# Patient Record
Sex: Male | Born: 1975 | Race: White | Hispanic: No | Smoking: Never smoker
Health system: Southern US, Community
[De-identification: ages and names within clinical notes are randomized; demographics above are authoritative.]

## PROBLEM LIST (undated history)

## (undated) DIAGNOSIS — R109 Unspecified abdominal pain: Secondary | ICD-10-CM

## (undated) DIAGNOSIS — N1330 Unspecified hydronephrosis: Secondary | ICD-10-CM

## (undated) DIAGNOSIS — N200 Calculus of kidney: Secondary | ICD-10-CM

## (undated) DIAGNOSIS — Q6211 Congenital occlusion of ureteropelvic junction: Secondary | ICD-10-CM

## (undated) DIAGNOSIS — Q6239 Other obstructive defects of renal pelvis and ureter: Secondary | ICD-10-CM

## (undated) DIAGNOSIS — Z8719 Personal history of other diseases of the digestive system: Secondary | ICD-10-CM

---

## 2012-10-02 ENCOUNTER — Emergency Department (HOSPITAL_BASED_OUTPATIENT_CLINIC_OR_DEPARTMENT_OTHER): Payer: 59

## 2012-10-02 ENCOUNTER — Encounter (HOSPITAL_BASED_OUTPATIENT_CLINIC_OR_DEPARTMENT_OTHER): Payer: Self-pay | Admitting: *Deleted

## 2012-10-02 ENCOUNTER — Emergency Department (HOSPITAL_BASED_OUTPATIENT_CLINIC_OR_DEPARTMENT_OTHER)
Admission: EM | Admit: 2012-10-02 | Discharge: 2012-10-02 | Disposition: A | Discharge status: Home / Self Care | Payer: 59 | Attending: Emergency Medicine | Admitting: Emergency Medicine

## 2012-10-02 DIAGNOSIS — Z87448 Personal history of other diseases of urinary system: Secondary | ICD-10-CM | POA: Insufficient documentation

## 2012-10-02 DIAGNOSIS — Z87891 Personal history of nicotine dependence: Secondary | ICD-10-CM | POA: Insufficient documentation

## 2012-10-02 DIAGNOSIS — W03XXXA Other fall on same level due to collision with another person, initial encounter: Secondary | ICD-10-CM | POA: Insufficient documentation

## 2012-10-02 DIAGNOSIS — Y9363 Activity, rugby: Secondary | ICD-10-CM | POA: Insufficient documentation

## 2012-10-02 DIAGNOSIS — S20219A Contusion of unspecified front wall of thorax, initial encounter: Secondary | ICD-10-CM | POA: Insufficient documentation

## 2012-10-02 DIAGNOSIS — Y9239 Other specified sports and athletic area as the place of occurrence of the external cause: Secondary | ICD-10-CM | POA: Insufficient documentation

## 2012-10-02 NOTE — ED Provider Notes (Signed)
History     CSN: [id number]  Arrival date & time 10/02/12  1410   First MD Initiated Contact with Patient 10/02/12 1423      Chief Complaint  Patient presents with  . Rib Injury    (Consider location/radiation/quality/duration/timing/severity/associated sxs/prior treatment) HPI Comments: Patient was playing rugby yesterday and developed pain in the left ribs after being hit.  It is hard to breathe deep but feels as though he is getting enough air.  Worse with inspiration, movement, better with rest.  No other injuries.  No fever or cough.    The history is provided by the patient.    Past Medical History  Diagnosis Date  . Hydronephrosis     History reviewed. No pertinent past surgical history.  History reviewed. No pertinent family history.  History  Substance Use Topics  . Smoking status: Former Games developer  . Smokeless tobacco: Not on file  . Alcohol Use: No      Review of Systems  All other systems reviewed and are negative.    Allergies  Review of patient's allergies indicates no known allergies.  Home Medications  No current outpatient prescriptions on file.  BP 143/71  Pulse 55  Temp(Src) 97.4 F (36.3 C) (Oral)  Resp 18  Ht 5\' 7"  (1.702 m)  Wt 175 lb (79.379 kg)  BMI 27.4 kg/m2  SpO2 100%  Physical Exam  Nursing note and vitals reviewed. Constitutional: He is oriented to person, place, and time. He appears well-developed and well-nourished. No distress.  HENT:  Head: Normocephalic and atraumatic.  Mouth/Throat: Oropharynx is clear and moist.  Neck: Normal range of motion. Neck supple.  Cardiovascular: Normal rate and regular rhythm.   No murmur heard. Pulmonary/Chest: Effort normal and breath sounds normal. No respiratory distress. He has no wheezes.  There is ttp to the left chest wall just inferior to the left pectoral muscle.  Breath sounds are clear and equal bilaterally.  Abdominal: Soft. Bowel sounds are normal. He exhibits no distension.  There is no tenderness.  Musculoskeletal: Normal range of motion. He exhibits no edema.  Lymphadenopathy:    He has no cervical adenopathy.  Neurological: He is alert and oriented to person, place, and time.  Skin: Skin is warm and dry. He is not diaphoretic.    ED Course  Procedures (including critical care time)  Labs Reviewed - No data to display No results found.   No diagnosis found.    MDM  The xrays show no evidence for fracture or ptx.  Suspect chest wall contusion.  Will treat with nsaids, rest, time.  Follow up prn.        Geoffery Lyons, MD 10/02/12 [phone]

## 2012-10-02 NOTE — ED Notes (Addendum)
Pt states he was playing Rugby yest and made a tackle. Finished the game and today is having increased pain with movement and ROM. Recovering addict. Does not want pain meds. Dedra Skeens of NSAIDS.

## 2012-11-27 ENCOUNTER — Emergency Department (HOSPITAL_BASED_OUTPATIENT_CLINIC_OR_DEPARTMENT_OTHER)
Admission: EM | Admit: 2012-11-27 | Discharge: 2012-11-27 | Disposition: A | Discharge status: Home / Self Care | Payer: 59 | Attending: Emergency Medicine | Admitting: Emergency Medicine

## 2012-11-27 ENCOUNTER — Encounter (HOSPITAL_BASED_OUTPATIENT_CLINIC_OR_DEPARTMENT_OTHER): Payer: Self-pay | Admitting: *Deleted

## 2012-11-27 ENCOUNTER — Emergency Department (HOSPITAL_BASED_OUTPATIENT_CLINIC_OR_DEPARTMENT_OTHER): Payer: 59

## 2012-11-27 DIAGNOSIS — R110 Nausea: Secondary | ICD-10-CM | POA: Insufficient documentation

## 2012-11-27 DIAGNOSIS — R300 Dysuria: Secondary | ICD-10-CM | POA: Insufficient documentation

## 2012-11-27 DIAGNOSIS — N1330 Unspecified hydronephrosis: Secondary | ICD-10-CM | POA: Insufficient documentation

## 2012-11-27 DIAGNOSIS — Z8719 Personal history of other diseases of the digestive system: Secondary | ICD-10-CM | POA: Insufficient documentation

## 2012-11-27 DIAGNOSIS — Z87891 Personal history of nicotine dependence: Secondary | ICD-10-CM | POA: Insufficient documentation

## 2012-11-27 LAB — CBC WITH DIFFERENTIAL/PLATELET
HCT: 45.7 % (ref 39.0–52.0)
Hemoglobin: 16.1 g/dL (ref 13.0–17.0)
Lymphocytes Relative: 23 % (ref 12–46)
Lymphs Abs: 1.5 10*3/uL (ref 0.7–4.0)
Monocytes Absolute: 0.8 10*3/uL (ref 0.1–1.0)
Monocytes Relative: 12 % (ref 3–12)
Neutro Abs: 4.2 10*3/uL (ref 1.7–7.7)
Neutrophils Relative %: 64 % (ref 43–77)
RBC: 5.49 MIL/uL (ref 4.22–5.81)
WBC: 6.5 10*3/uL (ref 4.0–10.5)

## 2012-11-27 LAB — URINALYSIS, MICROSCOPIC ONLY
Glucose, UA: NEGATIVE mg/dL
Hgb urine dipstick: NEGATIVE
Ketones, ur: 15 mg/dL — AB
Protein, ur: NEGATIVE mg/dL
Urobilinogen, UA: 1 mg/dL (ref 0.0–1.0)

## 2012-11-27 LAB — COMPREHENSIVE METABOLIC PANEL
AST: 31 U/L (ref 0–37)
Albumin: 4.3 g/dL (ref 3.5–5.2)
Alkaline Phosphatase: 104 U/L (ref 39–117)
BUN: 20 mg/dL (ref 6–23)
CO2: 28 mEq/L (ref 19–32)
Chloride: 106 mEq/L (ref 96–112)
Creatinine, Ser: 1.5 mg/dL — ABNORMAL HIGH (ref 0.50–1.35)
GFR calc non Af Amer: 58 mL/min — ABNORMAL LOW (ref >90)
Potassium: 4.2 mEq/L (ref 3.5–5.1)
Total Bilirubin: 0.8 mg/dL (ref 0.3–1.2)

## 2012-11-27 MED ORDER — ONDANSETRON HCL 4 MG/2ML IJ SOLN: 4.0000 mg | Freq: Once | INTRAMUSCULAR | Status: AC

## 2012-11-27 MED ORDER — ONDANSETRON HCL 4 MG/2ML IJ SOLN
INTRAMUSCULAR | Status: AC
  Administered 2012-11-27: 4 mg via INTRAVENOUS
  Filled 2012-11-27: qty 2

## 2012-11-27 MED ORDER — OXYCODONE-ACETAMINOPHEN 5-325 MG PO TABS: 1.0000 | ORAL_TABLET | ORAL | Status: DC | PRN

## 2012-11-27 MED ORDER — KETOROLAC TROMETHAMINE 30 MG/ML IJ SOLN
30.0000 mg | Freq: Once | INTRAMUSCULAR | Status: AC
  Administered 2012-11-27: 30 mg via INTRAVENOUS
  Filled 2012-11-27: qty 1

## 2012-11-27 MED ORDER — HYDROMORPHONE HCL PF 2 MG/ML IJ SOLN: 2.0000 mg | Freq: Once | INTRAMUSCULAR | Status: AC

## 2012-11-27 MED ORDER — HYDROMORPHONE HCL PF 2 MG/ML IJ SOLN
INTRAMUSCULAR | Status: AC
  Administered 2012-11-27: 2 mg via INTRAVENOUS
  Filled 2012-11-27: qty 1

## 2012-11-27 NOTE — ED Notes (Signed)
Pt states he has had RUQ and Right flank pain x 12 hrs. 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

## 2012-11-27 NOTE — ED Provider Notes (Signed)
History  This chart was scribed for Jack Human, MD by Greggory Stallion, ED Scribe. This patient was seen in room MH08/MH08 and the patient's care was started at 5:06 PM.   CSN: [id number]  Arrival date & time 11/27/12  1608   First MD Initiated Contact with Patient 11/27/12 1706      Chief Complaint  Patient presents with  . Flank Pain  . Abdominal Pain     Patient is a 37 y.o. male presenting with abdominal pain. The history is provided by the patient. No language interpreter was used.  Abdominal Pain Pain location:  R flank, RUQ and epigastric Pain severity:  Moderate Onset quality:  Sudden Duration:  10 hours Timing:  Constant Progression:  Unchanged Chronicity:  New Relieved by:  Nothing Worsened by:  Nothing tried Ineffective treatments:  None tried Associated symptoms: nausea   Associated symptoms: no chest pain, no chills, no constipation, no cough, no diarrhea, no fever, no shortness of breath, no sore throat and no vomiting     HPI Comments: Jack Becker is a 37 y.o. male with h/o hydronephrosis and right ureter obstruction who presents to the Emergency Department complaining of moderate, constant, epigastric, RUQ and right flank pain that started at 5 AM this morning. There is associated nausea. Patient states that he hasn't had a urine void for several hours. Patient denies any precipitating factors for pain and states that he has had no recent changes in diet, trauma or injury to the affected areas. He states that current symptoms are similar in character to past history. He states that he does not have a local urologist, but has seen a specialist in [place] in the past. He had a renal scan/CT more than 1 year ago which showed 50% right kidney function. Patient denies vomiting, fever, neck pain, sore throat, visual disturbance, CP, cough, SOB, diarrhea, back pain, HA, weakness, numbness and rash as associated symptoms. Pt states he would like non-narcotic pain  medication. Pt denies any smoking or alcohol use.     Past Medical History  Diagnosis Date  . Hydronephrosis   . Diverticulitis     History reviewed. No pertinent past surgical history.  History reviewed. No pertinent family history.  History  Substance Use Topics  . Smoking status: Former Games developer  . Smokeless tobacco: Not on file  . Alcohol Use: No      Review of Systems  Constitutional: Negative for fever and chills.  HENT: Negative for sore throat and rhinorrhea.   Eyes: Negative for visual disturbance.  Respiratory: Negative for cough and shortness of breath.   Cardiovascular: Negative for chest pain.  Gastrointestinal: Positive for nausea and abdominal pain. Negative for vomiting, diarrhea and constipation.  Genitourinary: Positive for flank pain and difficulty urinating.  Musculoskeletal: Negative for back pain.  Skin: Negative for rash.  Neurological: Negative for dizziness and headaches.  Hematological: Does not bruise/bleed easily.  Psychiatric/Behavioral: Negative for confusion.    Allergies  Review of patient's allergies indicates no known allergies.  Home Medications  No current outpatient prescriptions on file.  Triage Vitals: BP 128/78  Pulse 58  Temp(Src) 98.3 F (36.8 C) (Oral)  Resp 18  Ht 5\' 7"  (1.702 m)  Wt 175 lb (79.379 kg)  BMI 27.4 kg/m2  SpO2 100%  Physical Exam  Constitutional: He is oriented to person, place, and time. He appears well-developed and well-nourished.  HENT:  Head: Normocephalic and atraumatic.  Mouth/Throat: Oropharynx is clear and moist.  Moist mucous membranes.  Eyes: Conjunctivae and EOM are normal. Pupils are equal, round, and reactive to light.  Neck: Normal range of motion. Neck supple.  Cardiovascular: Normal rate and normal heart sounds.   Pulmonary/Chest: Effort normal and breath sounds normal.  Abdominal: Soft. He exhibits no mass. There is tenderness in the epigastric area.  Genitourinary:  Right CVA  tenderness.  Musculoskeletal: Normal range of motion.  Neurological: He is alert and oriented to person, place, and time.  Skin: Skin is warm and dry.    ED Course  Procedures (including critical care time)  DIAGNOSTIC STUDIES: Oxygen Saturation is 100% on RA, normal by my interpretation.    COORDINATION OF CARE: 5:13 PM-Discussed treatment plan which includes Toradol 30 mg, CT of abdomen/pelvis with contrast, CBC with diff, CMP and UA with pt at bedside and pt agreed to plan.   7:40 PM-Discussed test results with pt that showed normal results. Discussed with pt that his CT scan showed hydronephrosis. Will page on call urologist.   8:08 PM- Patient's case discussed with Dr. Ihor Gully of [facility] in [place]. States that condition is likely congenital and has advised patient to follow up with him tomorrow. Patient verbalizes understanding and agrees. He also agrees to accept prescription for Percocet, states his parents will help him control intake.     Labs Reviewed  COMPREHENSIVE METABOLIC PANEL - Abnormal; Notable for the following:    Creatinine, Ser 1.50 (*)    GFR calc non Af Amer 58 (*)    GFR calc Af Amer 67 (*)    All other components within normal limits  URINALYSIS, MICROSCOPIC ONLY - Abnormal; Notable for the following:    Color, Urine AMBER (*)    Specific Gravity, Urine 1.038 (*)    Ketones, ur 15 (*)    Bacteria, UA FEW (*)    Crystals CA OXALATE CRYSTALS (*)    All other components within normal limits  CBC WITH DIFFERENTIAL    Ct Abdomen Pelvis Wo Contrast  11/27/2012  *RADIOLOGY REPORT*  Clinical Data: Right flank pain.  Prior right ureteral obstruction. History U P J obstruction.  CT ABDOMEN AND PELVIS WITHOUT CONTRAST  Technique:  Multidetector CT imaging of the abdomen and pelvis was performed following the standard protocol without intravenous contrast.  Comparison: None.  Findings: Severe right hydronephrosis.  The right ureters decompressed.   Findings compatible with severe UPJ obstruction, presumably chronic as the overlying renal parenchyma is then. Small nonobstructing bilateral renal stones.  Liver, spleen, pancreas, adrenals and gallbladder unremarkable.  Large and small bowel grossly unremarkable.  No free fluid, free air or adenopathy.  Urinary bladder decompressed.  No acute bony abnormality. Lung bases are clear.  No effusions. Heart is normal size.  Old partially healed left anterior lower rib fractures.  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  Overlying renal cortical thinning suggesting chronic process.  Punctate bilateral nephrolithiasis.   Original Report Authenticated By: Charlett Nose, M.D.     CT showed a very large hydronephrosis of the right kidney.  Case was discussed with Ihor Gully, M.D., urologist on call.  He advised that pt would need to be seen to have urologic treatment in the office.  He reviewed the CT scan, and advised that this was not a surgical emergency, and that the patient did not need immediate surgery.  I advised pt how to contact Dr. Margrett Rud office, and prescribed Percocet q4h prn pain.   1. Hydronephrosis of right kidney  I personally performed the services described in this documentation, which was scribed in my presence. The recorded information has been reviewed and is accurate.  Jack Human, MD      Carleene Cooper III, MD 11/27/12 2019

## 2012-11-30 ENCOUNTER — Other Ambulatory Visit: Payer: Self-pay | Admitting: Urology

## 2012-12-05 ENCOUNTER — Encounter (HOSPITAL_BASED_OUTPATIENT_CLINIC_OR_DEPARTMENT_OTHER): Payer: Self-pay | Admitting: *Deleted

## 2012-12-05 NOTE — H&P (Signed)
ctive Problems Problems  1. 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 Microscopic Hematuria 599.72 3. Nephrolithiasis Of Both Kidneys 592.0 4. Pyuria 791.9  History of Present Illness  Jack Becker is a 37 yo WM sent from the ER for right hydro.  He had an episode of right flank pain about a year ago and was evaluated in [place] by Dr. Excell Seltzer and was to be scheduled for a pyeloplasty but he ended up in an inpatient treatment center in [place] and is no longer working in [place].  The pain hit again Sunday AM and a CT showed a massively dilated right kidney consistent with a UPJ obstruction.  He has small bilateral stones as well.  He has had intermittant pain since 2003 but was not diagnosed until last year.  He had a renal scan with reduced function on the right.   Past Medical History Problems  1. History of  Anxiety (Symptom) 300.00 2. History of  Depression 311 3. History of  Drug Dependence 304.90 4. History of  Hypertension 401.9 5. History of  Nephrolithiasis V13.01  Surgical History Problems  1. History of  No Surgical Problems  Current Meds 1. Percocet TABS; Therapy: (Recorded:23Apr2014) to  Allergies Medication  1. No Known Drug Allergies  Family History Problems  1. Family history of  Death In The Family Mother 2. 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  Gastrointestinal: nausea, flank pain, heartburn and constipation.  Endocrine: polydipsia.  Musculoskeletal: back pain.     Vitals Vital Signs [Data Includes: Last 1 Day]  23Apr2014 11:23AM  BMI Calculated: 27.47 BSA Calculated: 1.91 Height: 5 ft 7 in Weight: 175 lb  Blood Pressure: 150 / 77 Temperature: 98.3 F Heart Rate: 54  Physical Exam Constitutional: Well nourished and well developed . No acute distress.  ENT:. The ears and nose are normal in appearance.  Neck: The appearance of the neck is normal and no neck mass is present.  Pulmonary: No respiratory distress and normal respiratory rhythm and effort.  Cardiovascular: Heart rate and rhythm are normal . No peripheral edema.  Abdomen: No masses are palpated. Mild tenderness in the RUQ is present. mild right CVA tenderness. No hernias are palpable. No hepatosplenomegaly noted.  Lymphatics: The supraclavicular and inguinal nodes are not enlarged or tender.  Skin: Normal skin turgor, no visible rash and no visible skin lesions.  Neuro/Psych:. Mood and affect are appropriate.    Results/Data Urine [Data Includes: Last 1 Day]   23Apr2014  COLOR YELLOW   APPEARANCE CLEAR   SPECIFIC GRAVITY 1.025   pH 6.0   GLUCOSE NEG mg/dL  BILIRUBIN NEG   KETONE NEG mg/dL  BLOOD MOD   PROTEIN TRACE mg/dL  UROBILINOGEN 2 mg/dL  NITRITE NEG   LEUKOCYTE ESTERASE NEG   SQUAMOUS EPITHELIAL/HPF RARE   WBC 11-20 WBC/hpf  RBC 11-20 RBC/hpf  BACTERIA RARE   CRYSTALS NONE SEEN   CASTS Hyaline casts noted    Old records or history reviewed: I have reviewed the ER records and labs. His Cr is 1.5 with a calculated GFR of  58.  The following images/tracing/specimen were independently visualized:  I have reviewed the CT from the ER.    Assessment Assessed  1. 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 Nephrolithiasis Of Both Kidneys 592.0 3. Microscopic Hematuria 599.72 4. 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 He also has some small bilateral stones.  His renal function is  borderline normal with a GFR calculated at 58 and a Creatinine of 1.50.   He is muscular which may impact that level.   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 Follow-up Office  Follow-up  Requested for: 23Apr2014 2. Follow-up Schedule Surgery Office  Follow-up  Done: 23Apr2014 3. LASIX RENOGRAM  Requested for: Approx 07May2014 Health Maintenance (V70.0)  4. UA With REFLEX  Done: 23Apr2014 11:00AM   He needs a stent placed and then a renogram to assess function. After the stent is placed, I will have him f/u with Dr. Berneice Heinrich for consideration of a pyeloplasty vs nephrectomy.  I reviewed the risks of the stent including bleeding, infection, ureteral injury, need for a perc, thrombotic events and anesthetic complications.

## 2012-12-05 NOTE — Progress Notes (Signed)
NPO AFTER MN INCLUDING NO DIPPING TOBACCO, PT VERBALIZED UNDERSTANDING. ARRIVES AT [phone]. NEEDS HG. MAY TAKE PERCOCET W/ SIPS OF WATER IF NEEDED.

## 2012-12-06 ENCOUNTER — Ambulatory Visit (HOSPITAL_BASED_OUTPATIENT_CLINIC_OR_DEPARTMENT_OTHER): Payer: 59 | Admitting: Anesthesiology

## 2012-12-06 ENCOUNTER — Encounter (HOSPITAL_BASED_OUTPATIENT_CLINIC_OR_DEPARTMENT_OTHER): Payer: Self-pay | Admitting: Anesthesiology

## 2012-12-06 ENCOUNTER — Encounter (HOSPITAL_BASED_OUTPATIENT_CLINIC_OR_DEPARTMENT_OTHER)
Admission: RE | Disposition: A | Discharge status: Home / Self Care | Payer: Self-pay | Source: Ambulatory Visit | Attending: Urology

## 2012-12-06 ENCOUNTER — Ambulatory Visit (HOSPITAL_BASED_OUTPATIENT_CLINIC_OR_DEPARTMENT_OTHER)
Admission: RE | Admit: 2012-12-06 | Discharge: 2012-12-06 | Disposition: A | Discharge status: Home / Self Care | Payer: 59 | Source: Ambulatory Visit | Attending: Urology | Admitting: Urology

## 2012-12-06 DIAGNOSIS — F411 Generalized anxiety disorder: Secondary | ICD-10-CM | POA: Insufficient documentation

## 2012-12-06 DIAGNOSIS — N200 Calculus of kidney: Secondary | ICD-10-CM | POA: Insufficient documentation

## 2012-12-06 DIAGNOSIS — Q6239 Other obstructive defects of renal pelvis and ureter: Secondary | ICD-10-CM | POA: Insufficient documentation

## 2012-12-06 DIAGNOSIS — I129 Hypertensive chronic kidney disease with stage 1 through stage 4 chronic kidney disease, or unspecified chronic kidney disease: Secondary | ICD-10-CM | POA: Insufficient documentation

## 2012-12-06 DIAGNOSIS — K219 Gastro-esophageal reflux disease without esophagitis: Secondary | ICD-10-CM | POA: Insufficient documentation

## 2012-12-06 DIAGNOSIS — F329 Major depressive disorder, single episode, unspecified: Secondary | ICD-10-CM | POA: Insufficient documentation

## 2012-12-06 DIAGNOSIS — R82998 Other abnormal findings in urine: Secondary | ICD-10-CM | POA: Insufficient documentation

## 2012-12-06 DIAGNOSIS — N35919 Unspecified urethral stricture, male, unspecified site: Secondary | ICD-10-CM | POA: Insufficient documentation

## 2012-12-06 DIAGNOSIS — K5900 Constipation, unspecified: Secondary | ICD-10-CM | POA: Insufficient documentation

## 2012-12-06 DIAGNOSIS — F17200 Nicotine dependence, unspecified, uncomplicated: Secondary | ICD-10-CM | POA: Insufficient documentation

## 2012-12-06 DIAGNOSIS — F3289 Other specified depressive episodes: Secondary | ICD-10-CM | POA: Insufficient documentation

## 2012-12-06 DIAGNOSIS — N269 Renal sclerosis, unspecified: Secondary | ICD-10-CM | POA: Insufficient documentation

## 2012-12-06 DIAGNOSIS — R631 Polydipsia: Secondary | ICD-10-CM | POA: Insufficient documentation

## 2012-12-06 HISTORY — DX: Personal history of other diseases of the digestive system: Z87.19

## 2012-12-06 HISTORY — PX: CYSTOSCOPY W/ URETERAL STENT PLACEMENT: SHX1429

## 2012-12-06 HISTORY — DX: Unspecified abdominal pain: R10.9

## 2012-12-06 HISTORY — DX: Calculus of kidney: N20.0

## 2012-12-06 HISTORY — DX: Unspecified hydronephrosis: N13.30

## 2012-12-06 LAB — POCT HEMOGLOBIN-HEMACUE: Hemoglobin: 15.9 g/dL (ref 13.0–17.0)

## 2012-12-06 SURGERY — CYSTOSCOPY, WITH RETROGRADE PYELOGRAM AND URETERAL STENT INSERTION
Anesthesia: General | Site: Ureter | Laterality: Right | Wound class: Clean Contaminated

## 2012-12-06 MED ORDER — PROPOFOL 10 MG/ML IV BOLUS
INTRAVENOUS | Status: DC | PRN
  Administered 2012-12-06: 300 mg via INTRAVENOUS

## 2012-12-06 MED ORDER — FENTANYL CITRATE 0.05 MG/ML IJ SOLN
INTRAMUSCULAR | Status: DC | PRN
  Administered 2012-12-06: 25 ug via INTRAVENOUS
  Administered 2012-12-06: 50 ug via INTRAVENOUS
  Administered 2012-12-06: 25 ug via INTRAVENOUS
  Administered 2012-12-06: 50 ug via INTRAVENOUS
  Administered 2012-12-06 (×2): 25 ug via INTRAVENOUS

## 2012-12-06 MED ORDER — PROMETHAZINE HCL 25 MG/ML IJ SOLN
6.2500 mg | INTRAMUSCULAR | Status: DC | PRN
  Filled 2012-12-06: qty 1

## 2012-12-06 MED ORDER — LACTATED RINGERS IV SOLN
INTRAVENOUS | Status: DC
  Administered 2012-12-06: 09:00:00 via INTRAVENOUS
  Filled 2012-12-06: qty 1000

## 2012-12-06 MED ORDER — ONDANSETRON HCL 4 MG/2ML IJ SOLN
INTRAMUSCULAR | Status: DC | PRN
  Administered 2012-12-06: 4 mg via INTRAVENOUS

## 2012-12-06 MED ORDER — HYOSCYAMINE SULFATE 0.125 MG SL SUBL: 0.1250 mg | SUBLINGUAL_TABLET | SUBLINGUAL | Status: DC | PRN

## 2012-12-06 MED ORDER — FENTANYL CITRATE 0.05 MG/ML IJ SOLN
25.0000 ug | INTRAMUSCULAR | Status: DC | PRN
  Filled 2012-12-06: qty 1

## 2012-12-06 MED ORDER — BELLADONNA ALKALOIDS-OPIUM 16.2-60 MG RE SUPP
RECTAL | Status: DC | PRN
  Administered 2012-12-06: 1 via RECTAL

## 2012-12-06 MED ORDER — ACETAMINOPHEN 325 MG PO TABS
650.0000 mg | ORAL_TABLET | ORAL | Status: DC | PRN
  Filled 2012-12-06: qty 2

## 2012-12-06 MED ORDER — SODIUM CHLORIDE 0.9 % IR SOLN
Status: DC | PRN
  Administered 2012-12-06: 3000 mL via INTRAVESICAL

## 2012-12-06 MED ORDER — LACTATED RINGERS IV SOLN
INTRAVENOUS | Status: DC | PRN
  Administered 2012-12-06: 09:00:00 via INTRAVENOUS

## 2012-12-06 MED ORDER — IOHEXOL 350 MG/ML SOLN
INTRAVENOUS | Status: DC | PRN
  Administered 2012-12-06: 15 mL

## 2012-12-06 MED ORDER — CIPROFLOXACIN IN D5W 400 MG/200ML IV SOLN
400.0000 mg | INTRAVENOUS | Status: AC
  Administered 2012-12-06: 400 mg via INTRAVENOUS
  Filled 2012-12-06: qty 200

## 2012-12-06 MED ORDER — PHENAZOPYRIDINE HCL 200 MG PO TABS: 200.0000 mg | ORAL_TABLET | Freq: Three times a day (TID) | ORAL | Status: DC | PRN

## 2012-12-06 MED ORDER — OXYCODONE-ACETAMINOPHEN 5-325 MG PO TABS: 1.0000 | ORAL_TABLET | ORAL | Status: DC | PRN

## 2012-12-06 MED ORDER — ACETAMINOPHEN 650 MG RE SUPP
650.0000 mg | RECTAL | Status: DC | PRN
  Filled 2012-12-06: qty 1

## 2012-12-06 MED ORDER — OXYCODONE HCL 5 MG PO TABS
5.0000 mg | ORAL_TABLET | ORAL | Status: DC | PRN
  Filled 2012-12-06: qty 2

## 2012-12-06 MED ORDER — SODIUM CHLORIDE 0.9 % IJ SOLN
3.0000 mL | INTRAMUSCULAR | Status: DC | PRN
  Filled 2012-12-06: qty 3

## 2012-12-06 MED ORDER — SODIUM CHLORIDE 0.9 % IJ SOLN
3.0000 mL | Freq: Two times a day (BID) | INTRAMUSCULAR | Status: DC
  Filled 2012-12-06: qty 3

## 2012-12-06 MED ORDER — ONDANSETRON HCL 4 MG/2ML IJ SOLN
4.0000 mg | Freq: Four times a day (QID) | INTRAMUSCULAR | Status: DC | PRN
  Filled 2012-12-06: qty 2

## 2012-12-06 MED ORDER — SODIUM CHLORIDE 0.9 % IV SOLN
250.0000 mL | INTRAVENOUS | Status: DC | PRN
  Filled 2012-12-06: qty 250

## 2012-12-06 MED ORDER — LIDOCAINE HCL 2 % EX GEL
CUTANEOUS | Status: DC | PRN
  Administered 2012-12-06: 1 via URETHRAL

## 2012-12-06 MED ORDER — KETOROLAC TROMETHAMINE 30 MG/ML IJ SOLN
INTRAMUSCULAR | Status: DC | PRN
  Administered 2012-12-06: 30 mg via INTRAVENOUS

## 2012-12-06 MED ORDER — DEXAMETHASONE SODIUM PHOSPHATE 4 MG/ML IJ SOLN
INTRAMUSCULAR | Status: DC | PRN
  Administered 2012-12-06: 10 mg via INTRAVENOUS

## 2012-12-06 MED ORDER — LIDOCAINE HCL (CARDIAC) 20 MG/ML IV SOLN
INTRAVENOUS | Status: DC | PRN
  Administered 2012-12-06: 100 mg via INTRAVENOUS

## 2012-12-06 MED ORDER — MIDAZOLAM HCL 5 MG/5ML IJ SOLN
INTRAMUSCULAR | Status: DC | PRN
  Administered 2012-12-06 (×3): 1 mg via INTRAVENOUS

## 2012-12-06 SURGICAL SUPPLY — 18 items
BAG DRAIN URO-CYSTO SKYTR STRL (DRAIN) ×2 IMPLANT
CANISTER SUCT LVC 12 LTR MEDI- (MISCELLANEOUS) ×2 IMPLANT
CATH URET 5FR 28IN CONE TIP (BALLOONS)
CATH URET 5FR 28IN OPEN ENDED (CATHETERS) ×2 IMPLANT
CATH URET 5FR 70CM CONE TIP (BALLOONS) IMPLANT
CLOTH BEACON ORANGE TIMEOUT ST (SAFETY) ×2 IMPLANT
DRAPE CAMERA CLOSED 9X96 (DRAPES) ×2 IMPLANT
GLOVE BIO SURGEON STRL SZ 6.5 (GLOVE) ×2 IMPLANT
GLOVE INDICATOR 7.0 STRL GRN (GLOVE) ×2 IMPLANT
GLOVE SURG SS PI 8.0 STRL IVOR (GLOVE) ×2 IMPLANT
GOWN PREVENTION PLUS LG XLONG (DISPOSABLE) ×2 IMPLANT
GOWN STRL REIN XL XLG (GOWN DISPOSABLE) ×2 IMPLANT
GUIDEWIRE 0.038 PTFE COATED (WIRE) IMPLANT
GUIDEWIRE ANG ZIPWIRE 038X150 (WIRE) IMPLANT
GUIDEWIRE STR DUAL SENSOR (WIRE) ×2 IMPLANT
NS IRRIG 500ML POUR BTL (IV SOLUTION) ×2 IMPLANT
PACK CYSTOSCOPY (CUSTOM PROCEDURE TRAY) ×2 IMPLANT
STENT URET 6FRX26 CONTOUR (STENTS) ×2 IMPLANT

## 2012-12-06 NOTE — Brief Op Note (Signed)
12/06/2012  10:24 AM  PATIENT:  Jack Becker  37 y.o. male  PRE-OPERATIVE DIAGNOSIS:  RIGHT UPJ OBSTRUCTION   POST-OPERATIVE DIAGNOSIS:  Right UPJ Obstruction  PROCEDURE:  Procedure(s): CYSTOSCOPY WITH RIGHT RETROGRADE PYELOGRAM AND STENT PLACEMENT (Right)  SURGEON:  Surgeon(s) and Role:    * Anner Crete, MD - Primary  PHYSICIAN ASSISTANT:   ASSISTANTS: none   ANESTHESIA:   general  EBL:  Total I/O In: 100 [I.V.:100] Out: -   BLOOD ADMINISTERED:none  DRAINS: 6x26 right JJ stent   LOCAL MEDICATIONS USED:  Lidocaine jelly   SPECIMEN:  No Specimen  DISPOSITION OF SPECIMEN:  N/A  COUNTS:  YES  TOURNIQUET:  * No tourniquets in log *  DICTATION: .Other Dictation: Dictation Number [id number]  PLAN OF CARE: Discharge to home after PACU  PATIENT DISPOSITION:  PACU - hemodynamically stable.   Delay start of Pharmacological VTE agent (>24hrs) due to surgical blood loss or risk of bleeding: not applicable

## 2012-12-06 NOTE — Anesthesia Preprocedure Evaluation (Signed)
Anesthesia Evaluation  Patient identified by MRN, date of birth, ID band Patient awake  General Assessment Comment:Right hydronephrosis from UPJ obstruction.  Reviewed: Allergy & Precautions, H&P , NPO status , Patient's Chart, lab work & pertinent test results  Airway Mallampati: II TM Distance: >3 FB Neck ROM: Full    Dental no notable dental hx.    Pulmonary neg pulmonary ROS,  breath sounds clear to auscultation  Pulmonary exam normal       Cardiovascular Exercise Tolerance: Good negative cardio ROS  Rhythm:Regular Rate:Normal     Neuro/Psych negative neurological ROS  negative psych ROS   GI/Hepatic negative GI ROS, (+)     substance abuse  alcohol use,   Endo/Other  negative endocrine ROS  Renal/GU Renal InsufficiencyRenal diseaseCr 1.5 K 4.2  negative genitourinary   Musculoskeletal negative musculoskeletal ROS (+)   Abdominal   Peds negative pediatric ROS (+)  Hematology negative hematology ROS (+)   Anesthesia Other Findings   Reproductive/Obstetrics negative OB ROS                           Anesthesia Physical Anesthesia Plan  ASA: II  Anesthesia Plan: General   Post-op Pain Management:    Induction: Intravenous  Airway Management Planned: LMA  Additional Equipment:   Intra-op Plan:   Post-operative Plan: Extubation in OR  Informed Consent: I have reviewed the patients History and Physical, chart, labs and discussed the procedure including the risks, benefits and alternatives for the proposed anesthesia with the patient or authorized representative who has indicated his/her understanding and acceptance.   Dental advisory given  Plan Discussed with: CRNA  Anesthesia Plan Comments:         Anesthesia Quick Evaluation

## 2012-12-06 NOTE — Anesthesia Postprocedure Evaluation (Signed)
  Anesthesia Post-op Note  Patient: Jack Becker  Procedure(s) Performed: Procedure(s) (LRB): CYSTOSCOPY WITH RIGHT RETROGRADE PYELOGRAM AND STENT PLACEMENT (Right)  Patient Location: PACU  Anesthesia Type: General  Level of Consciousness: awake and alert   Airway and Oxygen Therapy: Patient Spontanous Breathing  Post-op Pain: mild  Post-op Assessment: Post-op Vital signs reviewed, Patient's Cardiovascular Status Stable, Respiratory Function Stable, Patent Airway and No signs of Nausea or vomiting  Last Vitals:  Filed Vitals:   12/06/12 1154  BP: 115/69  Pulse: 46  Temp: 36.2 C  Resp: 16    Post-op Vital Signs: stable   Complications: No apparent anesthesia complications

## 2012-12-06 NOTE — Anesthesia Procedure Notes (Signed)
Procedure Name: LMA Insertion Date/Time: 12/06/2012 9:57 AM Performed by: Jessica Priest Pre-anesthesia Checklist: Patient identified, Emergency Drugs available, Suction available and Patient being monitored Patient Re-evaluated:Patient Re-evaluated prior to inductionOxygen Delivery Method: Circle System Utilized Preoxygenation: Pre-oxygenation with 100% oxygen Intubation Type: IV induction Ventilation: Mask ventilation without difficulty LMA: LMA inserted LMA Size: 4.0 Number of attempts: 1 Airway Equipment and Method: bite block Placement Confirmation: positive ETCO2 Tube secured with: Tape Dental Injury: Teeth and Oropharynx as per pre-operative assessment

## 2012-12-06 NOTE — Interval H&P Note (Signed)
History and Physical Interval Note:  12/06/2012 9:48 AM  Jack Becker  has presented today for surgery, with the diagnosis of RIGHT UPJ OBSTRUCTION   The various methods of treatment have been discussed with the patient and family. After consideration of risks, benefits and other options for treatment, the patient has consented to  Procedure(s): CYSTOSCOPY WITH RIGHT RETROGRADE PYELOGRAM AND STENT PLACEMENT (Right) as a surgical intervention .  The patient's history has been reviewed, patient examined, no change in status, stable for surgery.  I have reviewed the patient's chart and labs.  Questions were answered to the patient's satisfaction.     Chiyeko Ferre J

## 2012-12-06 NOTE — Transfer of Care (Signed)
Immediate Anesthesia Transfer of Care Note  Patient: Jack Becker  Procedure(s) Performed: Procedure(s) (LRB): CYSTOSCOPY WITH RIGHT RETROGRADE PYELOGRAM AND STENT PLACEMENT (Right)  Patient Location: PACU  Anesthesia Type: General  Level of Consciousness: awake, sedated, patient cooperative and responds to stimulation  Airway & Oxygen Therapy: Patient Spontanous Breathing and Patient connected to face mask oxygen  Post-op Assessment: Report given to PACU RN, Post -op Vital signs reviewed and stable and Patient moving all extremities  Post vital signs: Reviewed and stable  Complications: No apparent anesthesia complications

## 2012-12-07 ENCOUNTER — Encounter (HOSPITAL_BASED_OUTPATIENT_CLINIC_OR_DEPARTMENT_OTHER): Payer: Self-pay | Admitting: Urology

## 2012-12-07 ENCOUNTER — Other Ambulatory Visit: Payer: Self-pay | Admitting: Urology

## 2012-12-07 DIAGNOSIS — N135 Crossing vessel and stricture of ureter without hydronephrosis: Secondary | ICD-10-CM

## 2012-12-07 NOTE — Op Note (Signed)
Jack Becker, Jack Becker              ACCOUNT NO.:  [id number]  MEDICAL RECORD NO.:  [id number]  LOCATION:                                 FACILITY:  PHYSICIAN:  Excell Seltzer. Annabell Howells, M.D.    DATE OF BIRTH:  [birth year]  DATE OF PROCEDURE:  12/06/2012 DATE OF DISCHARGE:                              OPERATIVE REPORT   PROCEDURES:  Cystoscopy, right retrograde pyelogram with interpretation and insertion of right double J stent.  PREOPERATIVE DIAGNOSIS:  Right ureteropelvic junction obstruction.  POSTOPERATIVE DIAGNOSIS:  Right ureteropelvic junction obstruction.  SURGEON:  Excell Seltzer. Annabell Howells, M.D.  ANESTHESIA:  General.  DRAINS:  A 6-French 26-cm double-J stent.  COMPLICATIONS:  None.  INDICATIONS:  Jack Becker is a 37 year old white male with a long history of intermittent right flank pain.  He was seen in [place], and was found to have a UPJ obstruction, but moved to [place] and did not follow up. He was recently in emergency room with acute pain.  I have elected to proceed with retrograde pyelogram and stent placement.  This will be followed by a Lasix renogram in a couple of weeks to assess his function prior to determining whether he needs a nephrectomy versus a pyeloplasty.  FINDINGS OF PROCEDURE:  He was given Cipro.  He was taken to the operating room where general anesthetic was induced.  He was placed in the lithotomy position.  His perineum and genitalia were prepped with Betadine solution and was draped in usual sterile fashion.  An attempt at passing the 22-French scope initially was unsuccessful due to mild meatal stenosis.  He was then dilated to 24-French with male sounds and the 22-French scope was then passed without further difficulty using the 12-degree lens.  Inspection revealed an otherwise normal urethra.  The prostatic urethra short without obstruction. Examination of the bladder revealed smooth wall without tumor, stones, or inflammation.  Ureteral orifices were  unremarkable.  The entire bladder could be examined with a 12-degree lens.  The right ureteral orifice was then cannulated with 5-French open-end catheter and contrast was instilled. The right retrograde pyelogram revealed a normal ureter to the level of the UPJ other than some kinking at the level of the UPJ.  The collecting system filled with gross dilation with blunting of the calices.  A Sensor wire was then passed through the open-end catheter into the collecting system.  The open-end catheter was advanced over the wire into the collecting system.  The wire was removed and an additional contrast was instilled to complete the retrograde.  A hydronephrotic drip was noted upon removal of the wire once the open-end was in the renal pelvis.  At this point, the guidewire was then reinserted through the open-end catheter.  The open-end catheter was removed and a 6-French 26-cm double- J stent without string was then advanced over the wire into the kidney. The wire was removed leaving a good coil in the kidney and a good coil in the bladder.  There were some bloody turbid efflux through the stent. The bladder was drained.  The cystoscope was removed.  The urethra was instilled with 10 mL of 2% lidocaine jelly.  A B and O  suppository had been placed at the beginning of the procedure.  The patient was taken down from lithotomy position.  His anesthetic was reversed.  He was moved to the recovery room in stable condition.  There were no complications.     Excell Seltzer. Annabell Howells, M.D.     JJW/MEDQ  D:  12/06/2012  T:  12/07/2012  Job:  [id number]

## 2012-12-20 ENCOUNTER — Encounter (HOSPITAL_COMMUNITY)
Admission: RE | Admit: 2012-12-20 | Discharge: 2012-12-20 | Disposition: A | Discharge status: Home / Self Care | Payer: 59 | Source: Ambulatory Visit | Attending: Urology | Admitting: Urology

## 2012-12-20 DIAGNOSIS — N1330 Unspecified hydronephrosis: Secondary | ICD-10-CM | POA: Insufficient documentation

## 2012-12-20 DIAGNOSIS — N135 Crossing vessel and stricture of ureter without hydronephrosis: Secondary | ICD-10-CM | POA: Insufficient documentation

## 2012-12-20 MED ORDER — TECHNETIUM TC 99M MERTIATIDE
14.5000 | Freq: Once | INTRAVENOUS | Status: AC | PRN
  Administered 2012-12-20: 15 via INTRAVENOUS

## 2012-12-20 MED ORDER — FUROSEMIDE 10 MG/ML IJ SOLN
40.0000 mg | Freq: Once | INTRAMUSCULAR | Status: DC
  Filled 2012-12-20: qty 4

## 2012-12-27 ENCOUNTER — Other Ambulatory Visit: Payer: Self-pay | Admitting: Urology

## 2012-12-29 ENCOUNTER — Emergency Department (HOSPITAL_COMMUNITY)
Admission: EM | Admit: 2012-12-29 | Discharge: 2012-12-29 | Disposition: A | Discharge status: Home / Self Care | Payer: 59 | Attending: Emergency Medicine | Admitting: Emergency Medicine

## 2012-12-29 ENCOUNTER — Encounter (HOSPITAL_COMMUNITY): Payer: Self-pay

## 2012-12-29 DIAGNOSIS — N135 Crossing vessel and stricture of ureter without hydronephrosis: Secondary | ICD-10-CM | POA: Insufficient documentation

## 2012-12-29 DIAGNOSIS — R109 Unspecified abdominal pain: Secondary | ICD-10-CM | POA: Insufficient documentation

## 2012-12-29 DIAGNOSIS — Z9889 Other specified postprocedural states: Secondary | ICD-10-CM | POA: Insufficient documentation

## 2012-12-29 DIAGNOSIS — Z87448 Personal history of other diseases of urinary system: Secondary | ICD-10-CM | POA: Insufficient documentation

## 2012-12-29 DIAGNOSIS — Z8719 Personal history of other diseases of the digestive system: Secondary | ICD-10-CM | POA: Insufficient documentation

## 2012-12-29 DIAGNOSIS — Z87442 Personal history of urinary calculi: Secondary | ICD-10-CM | POA: Insufficient documentation

## 2012-12-29 DIAGNOSIS — R34 Anuria and oliguria: Secondary | ICD-10-CM | POA: Insufficient documentation

## 2012-12-29 DIAGNOSIS — N1330 Unspecified hydronephrosis: Secondary | ICD-10-CM | POA: Insufficient documentation

## 2012-12-29 LAB — CBC
HCT: 39 % (ref 39.0–52.0)
MCH: 29.1 pg (ref 26.0–34.0)
MCV: 82.1 fL (ref 78.0–100.0)
RDW: 12.2 % (ref 11.5–15.5)
WBC: 5.9 10*3/uL (ref 4.0–10.5)

## 2012-12-29 LAB — URINALYSIS, ROUTINE W REFLEX MICROSCOPIC
Bilirubin Urine: NEGATIVE
Glucose, UA: NEGATIVE mg/dL
Hgb urine dipstick: NEGATIVE
Protein, ur: NEGATIVE mg/dL

## 2012-12-29 LAB — BASIC METABOLIC PANEL
BUN: 21 mg/dL (ref 6–23)
CO2: 28 mEq/L (ref 19–32)
Calcium: 9 mg/dL (ref 8.4–10.5)
Chloride: 107 mEq/L (ref 96–112)
Creatinine, Ser: 1.17 mg/dL (ref 0.50–1.35)
GFR calc Af Amer: >90 mL/min (ref >90)

## 2012-12-29 MED ORDER — KETOROLAC TROMETHAMINE 60 MG/2ML IM SOLN
60.0000 mg | Freq: Once | INTRAMUSCULAR | Status: DC
  Filled 2012-12-29: qty 2

## 2012-12-29 MED ORDER — OXYCODONE-ACETAMINOPHEN 5-325 MG PO TABS
2.0000 | ORAL_TABLET | Freq: Once | ORAL | Status: AC
  Administered 2012-12-29: 2 via ORAL
  Filled 2012-12-29: qty 2

## 2012-12-29 MED ORDER — OXYCODONE-ACETAMINOPHEN 5-325 MG PO TABS: 1.0000 | ORAL_TABLET | Freq: Four times a day (QID) | ORAL | Status: DC | PRN

## 2012-12-29 NOTE — ED Provider Notes (Signed)
History     CSN: [id number]  Arrival date & time 12/29/12  1913   First MD Initiated Contact with Patient 12/29/12 1934      No chief complaint on file.   (Consider location/radiation/quality/duration/timing/severity/associated sxs/prior treatment) HPI Comments: 37 year old male the past medical history of UPJ obstruction, right hydronephrosis and renal calculi presents to the emergency department complaining of worsening right-sided flank pain x1 day. Patient recently had her ureteral stent placed on April 29, removed 2 weeks later for hydronephrosis. He has a right pyeloplasty scheduled for June 13 by Dr. Berneice Heinrich. Takes Toradol at home for his flank pain, however today the Toradol did not help his pain. Describes the pain as sharp, beginning in his right flank radiating around to his right upper abdomen. States he is trying to drink a lot of water, however is not making a large amount of urine. Denies increased urinary frequency, urgency, dysuria or hematuria. Denies fever, chills, nausea or vomiting. He tried calling Dr. Emmaline Life office today and was advised to come to the emergency department.  The history is provided by the patient.    Past Medical History  Diagnosis Date  . Ureteropelvic junction obstruction     RIGHT  . Hydronephrosis, right   . Renal calculi     BILATERAL NON-OBSTRUCTIVE KIDNEY STONES  . History of diverticulitis of colon   . Right flank pain     Past Surgical History  Procedure Laterality Date  . Cystoscopy w/ ureteral stent placement Right 12/06/2012    Procedure: CYSTOSCOPY WITH RIGHT RETROGRADE PYELOGRAM AND STENT PLACEMENT;  Surgeon: Anner Crete, MD;  Location: [facility];  Service: Urology;  Laterality: Right;    No family history on file.  History  Substance Use Topics  . Smoking status: Never Smoker   . Smokeless tobacco: Current User    Types: Snuff     Comment: 15 YR TOBACCO USE  . Alcohol Use: No      Review of Systems    Constitutional: Negative for fever, chills and appetite change.  Respiratory: Negative for shortness of breath.   Cardiovascular: Negative for chest pain.  Genitourinary: Positive for flank pain and decreased urine volume. Negative for dysuria, urgency, frequency, hematuria, difficulty urinating and testicular pain.  All other systems reviewed and are negative.    Allergies  Review of patient's allergies indicates no known allergies.  Home Medications   Current Outpatient Rx  Name  Route  Sig  Dispense  Refill  . ketorolac (TORADOL) 10 MG tablet   Oral   Take 10 mg by mouth every 6 (six) hours as needed for pain.         . Multiple Vitamin (MULTIVITAMIN WITH MINERALS) TABS   Oral   Take 1 tablet by mouth daily.           BP 133/73  Pulse 53  Temp(Src) 98.7 F (37.1 C) (Oral)  Resp 15  Ht 5\' 8"  (1.727 m)  Wt 180 lb (81.647 kg)  BMI 27.38 kg/m2  SpO2 99%  Physical Exam  Nursing note and vitals reviewed. Constitutional: He is oriented to person, place, and time. He appears well-developed and well-nourished. No distress.  HENT:  Head: Normocephalic and atraumatic.  Mouth/Throat: Oropharynx is clear and moist.  Eyes: Conjunctivae are normal.  Neck: Normal range of motion. Neck supple.  Cardiovascular: Normal rate, regular rhythm and normal heart sounds.   Pulmonary/Chest: Effort normal and breath sounds normal. No respiratory distress.  Abdominal: Soft. Normal  appearance and bowel sounds are normal. He exhibits no distension and no mass. There is tenderness. There is CVA tenderness (right). There is no rigidity, no rebound and no guarding.    No peritoneal signs.  Musculoskeletal: Normal range of motion. He exhibits no edema.  Neurological: He is alert and oriented to person, place, and time.  Skin: Skin is warm and dry. He is not diaphoretic.  Psychiatric: He has a normal mood and affect. His behavior is normal.    ED Course  Procedures (including critical  care time)  Labs Reviewed  URINALYSIS, ROUTINE W REFLEX MICROSCOPIC - Abnormal; Notable for the following:    Specific Gravity, Urine 1.032 (*)    All other components within normal limits  BASIC METABOLIC PANEL - Abnormal; Notable for the following:    GFR calc non Af Amer 78 (*)    All other components within normal limits  CBC   No results found.   1. Flank pain   2. Hydronephrosis       MDM  37 y/o male with worsening flank pain. Known hydronephrosis, UPJ obstruction on right. Scheduled for pyeloplasty on 6/13 with Dr. Berneice Heinrich. Patient is afebrile, NAD. Abdomen soft, ND. Right CVA tenderness. U/A without infection, blood. BMP unremarkable. Patient also evaluated by Dr. Hyacinth Meeker who spoke with Dr. Vernie Ammons with urology who suggests giving patient stronger pain medication than toradol and having him f/u with Dr. Berneice Heinrich as scheduled. No further intervention needed today. Return precautions discussed. Patient states understanding of plan and is agreeable.         Trevor Mace, PA-C 12/29/12 2129

## 2012-12-29 NOTE — ED Provider Notes (Addendum)
37 year old male presents with a complaint of right flank and back pain. This has been for several days, is fairly severe, not associated with fever chills nausea or vomiting. He has had a slight decrease in urine output. According to the medical records the patient is currently being evaluated by urology for a pyeloplasty to be performed approximately 3 weeks. He has had severe hydronephrosis of his right kidney secondary to a UPJ obstruction which is high grade but did not appear to be related to a kidney stone. 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 This pain has been intermittent he has been pain-free for years at a time but has been much worse over the last month.  On exam he has a soft abdomen, CVA tenderness on the right, he is not appear to be in any significant distress. Heart and lung sounds are normal, no tachycardia, no fever.  Check renal function, check urinalysis, consult with urology, parenteral pain medications ordered.  Medical screening examination/treatment/procedure(s) were conducted as a shared visit with non-physician practitioner(s) and myself.  I personally evaluated the patient during the encounter  I discussed the care with Dr. Vernie Ammons of urology, he agrees that no emergent procedure is necessary, pain control and home to followup for his surgery as planned. Renal function better than last check.  Vida Roller, MD 12/29/12 2002  Vida Roller, MD 12/29/12 2122

## 2012-12-29 NOTE — ED Notes (Signed)
Visitors at bedside.

## 2012-12-29 NOTE — ED Notes (Signed)
Pt has urinary blockage and had stent placed recently.  Using toradol at home for pain.  Has pyeloplasty scheduled for June 13th.  Pain uncontrolled today.

## 2012-12-30 NOTE — ED Provider Notes (Signed)
Medical screening examination/treatment/procedure(s) were conducted as a shared visit with non-physician practitioner(s) and myself.  I personally evaluated the patient during the encounter  Please see my separate respective documentation pertaining to this patient encounter   Vida Roller, MD 12/30/12 [phone]

## 2013-01-04 ENCOUNTER — Other Ambulatory Visit (HOSPITAL_COMMUNITY): Payer: Self-pay | Admitting: Urology

## 2013-01-04 NOTE — Patient Instructions (Addendum)
20 Jack Becker  01/04/2013   Your procedure is scheduled on: 01-06-2013  Report to [facility] at 915 AM.  Call this number if you have problems the morning of surgery [phone]   Remember:   Do not eat food or drink liquids :After Midnight.     Take these medicines the morning of surgery with A SIP OF WATER: oxycodone if needed                                SEE [name] PREPARING FOR SURGERY SHEET   Do not wear jewelry, make-up or nail polish.  Do not wear lotions, powders, or perfumes. You may wear deodorant.   Men may shave face and neck.  Do not bring valuables to the hospital.  Contacts, dentures or bridgework may not be worn into surgery.  Leave suitcase in the car. After surgery it may be brought to your room.  For patients admitted to the hospital, checkout time is 11:00 AM the day of discharge.   Patients discharged the day of surgery will not be allowed to drive home.  Name and phone number of your driver:  Special Instructions: N/A   Please read over the following fact sheets that you were given: MRSA Information, blood fact sheet  Call [name] RN pre op nurse if needed [phone]    FAILURE TO FOLLOW THESE INSTRUCTIONS MAY RESULT IN THE CANCELLATION OF YOUR SURGERY. PATIENT SIGNATURE___________________________________________

## 2013-01-05 ENCOUNTER — Encounter (HOSPITAL_COMMUNITY)
Admission: RE | Admit: 2013-01-05 | Discharge: 2013-01-05 | Disposition: A | Discharge status: Home / Self Care | Payer: 59 | Source: Ambulatory Visit | Attending: Urology | Admitting: Urology

## 2013-01-05 ENCOUNTER — Encounter (HOSPITAL_COMMUNITY): Payer: Self-pay | Admitting: Pharmacy Technician

## 2013-01-05 ENCOUNTER — Encounter (HOSPITAL_COMMUNITY): Payer: Self-pay

## 2013-01-05 LAB — SURGICAL PCR SCREEN: MRSA, PCR: NEGATIVE

## 2013-01-05 MED ORDER — GENTAMICIN SULFATE 40 MG/ML IJ SOLN
400.0000 mg | INTRAVENOUS | Status: AC
  Administered 2013-01-06: 400 mg via INTRAVENOUS
  Filled 2013-01-05: qty 10

## 2013-01-06 ENCOUNTER — Encounter (HOSPITAL_COMMUNITY): Payer: Self-pay | Admitting: *Deleted

## 2013-01-06 ENCOUNTER — Ambulatory Visit (HOSPITAL_COMMUNITY): Payer: 59 | Admitting: Anesthesiology

## 2013-01-06 ENCOUNTER — Ambulatory Visit (HOSPITAL_COMMUNITY): Payer: 59

## 2013-01-06 ENCOUNTER — Inpatient Hospital Stay (HOSPITAL_COMMUNITY)
Admission: RE | Admit: 2013-01-06 | Discharge: 2013-01-09 | DRG: 661 | Disposition: A | Discharge status: Home / Self Care | Payer: 59 | Source: Ambulatory Visit | Attending: Urology | Admitting: Urology

## 2013-01-06 ENCOUNTER — Encounter (HOSPITAL_COMMUNITY)
Admission: RE | Disposition: A | Discharge status: Home / Self Care | Payer: Self-pay | Source: Ambulatory Visit | Attending: Urology

## 2013-01-06 ENCOUNTER — Encounter (HOSPITAL_COMMUNITY): Payer: Self-pay | Admitting: Anesthesiology

## 2013-01-06 DIAGNOSIS — Q638 Other specified congenital malformations of kidney: Secondary | ICD-10-CM

## 2013-01-06 DIAGNOSIS — N201 Calculus of ureter: Principal | ICD-10-CM | POA: Diagnosis present

## 2013-01-06 HISTORY — PX: CYSTOSCOPY W/ URETERAL STENT PLACEMENT: SHX1429

## 2013-01-06 HISTORY — PX: ROBOT ASSISTED PYELOPLASTY: SHX5143

## 2013-01-06 LAB — TYPE AND SCREEN
ABO/RH(D): O POS
Antibody Screen: NEGATIVE

## 2013-01-06 SURGERY — ROBOTIC ASSISTED PYELOPLASTY
Anesthesia: General | Laterality: Right | Wound class: Clean

## 2013-01-06 MED ORDER — MIDAZOLAM HCL 2 MG/2ML IJ SOLN
1.0000 mg | INTRAMUSCULAR | Status: DC | PRN
  Administered 2013-01-06: 2 mg via INTRAVENOUS

## 2013-01-06 MED ORDER — LACTATED RINGERS IR SOLN
Status: DC | PRN
  Administered 2013-01-06: 300 mL

## 2013-01-06 MED ORDER — MIDAZOLAM HCL 2 MG/2ML IJ SOLN
INTRAMUSCULAR | Status: AC
  Filled 2013-01-06: qty 2

## 2013-01-06 MED ORDER — FENTANYL CITRATE 0.05 MG/ML IJ SOLN
INTRAMUSCULAR | Status: DC | PRN
  Administered 2013-01-06: 50 ug via INTRAVENOUS

## 2013-01-06 MED ORDER — PROPOFOL 10 MG/ML IV BOLUS
INTRAVENOUS | Status: DC | PRN
  Administered 2013-01-06: 200 mg via INTRAVENOUS

## 2013-01-06 MED ORDER — CEFAZOLIN SODIUM-DEXTROSE 2-3 GM-% IV SOLR
2.0000 g | INTRAVENOUS | Status: AC
  Administered 2013-01-06: 2 g via INTRAVENOUS

## 2013-01-06 MED ORDER — EPHEDRINE SULFATE 50 MG/ML IJ SOLN
INTRAMUSCULAR | Status: DC | PRN
  Administered 2013-01-06: 10 mg via INTRAVENOUS
  Administered 2013-01-06: 7.5 mg via INTRAVENOUS
  Administered 2013-01-06: 5 mg via INTRAVENOUS
  Administered 2013-01-06: 10 mg via INTRAVENOUS

## 2013-01-06 MED ORDER — ACETAMINOPHEN 10 MG/ML IV SOLN
1000.0000 mg | Freq: Four times a day (QID) | INTRAVENOUS | Status: DC
  Administered 2013-01-06 – 2013-01-07 (×2): 1000 mg via INTRAVENOUS
  Filled 2013-01-06 (×4): qty 100

## 2013-01-06 MED ORDER — LIDOCAINE HCL 4 % MT SOLN
OROMUCOSAL | Status: DC | PRN
  Administered 2013-01-06: 4 mL via TOPICAL

## 2013-01-06 MED ORDER — IOHEXOL 300 MG/ML  SOLN
INTRAMUSCULAR | Status: DC | PRN
  Administered 2013-01-06: 10 mL via URETHRAL

## 2013-01-06 MED ORDER — SODIUM CHLORIDE 0.9 % IJ SOLN
INTRAMUSCULAR | Status: DC | PRN
  Administered 2013-01-06: 16:00:00

## 2013-01-06 MED ORDER — LACTATED RINGERS IV SOLN
INTRAVENOUS | Status: DC
  Administered 2013-01-06 (×2): via INTRAVENOUS
  Administered 2013-01-06: 1000 mL via INTRAVENOUS
  Administered 2013-01-06: 16:00:00 via INTRAVENOUS

## 2013-01-06 MED ORDER — LIDOCAINE HCL (CARDIAC) 20 MG/ML IV SOLN
INTRAVENOUS | Status: DC | PRN
  Administered 2013-01-06: 100 mg via INTRAVENOUS

## 2013-01-06 MED ORDER — NEOSTIGMINE METHYLSULFATE 1 MG/ML IJ SOLN
INTRAMUSCULAR | Status: DC | PRN
  Administered 2013-01-06: 5 mg via INTRAVENOUS

## 2013-01-06 MED ORDER — DEXAMETHASONE SODIUM PHOSPHATE 10 MG/ML IJ SOLN
INTRAMUSCULAR | Status: DC | PRN
  Administered 2013-01-06: 10 mg via INTRAVENOUS

## 2013-01-06 MED ORDER — CEFAZOLIN SODIUM-DEXTROSE 2-3 GM-% IV SOLR
INTRAVENOUS | Status: AC
Start: 2013-01-06 — End: 2013-01-06
  Filled 2013-01-06: qty 50

## 2013-01-06 MED ORDER — PHENYLEPHRINE HCL 10 MG/ML IJ SOLN
INTRAMUSCULAR | Status: DC | PRN
  Administered 2013-01-06 (×3): 80 ug via INTRAVENOUS
  Administered 2013-01-06: 40 ug via INTRAVENOUS
  Administered 2013-01-06 (×2): 80 ug via INTRAVENOUS

## 2013-01-06 MED ORDER — ONDANSETRON HCL 4 MG/2ML IJ SOLN
INTRAMUSCULAR | Status: DC | PRN
  Administered 2013-01-06: 4 mg via INTRAVENOUS

## 2013-01-06 MED ORDER — HYDROMORPHONE HCL PF 1 MG/ML IJ SOLN
0.2500 mg | INTRAMUSCULAR | Status: DC | PRN
  Administered 2013-01-06 (×4): 0.5 mg via INTRAVENOUS

## 2013-01-06 MED ORDER — HYDROMORPHONE HCL PF 1 MG/ML IJ SOLN
INTRAMUSCULAR | Status: AC
  Filled 2013-01-06: qty 1

## 2013-01-06 MED ORDER — SUFENTANIL CITRATE 50 MCG/ML IV SOLN
INTRAVENOUS | Status: DC | PRN
  Administered 2013-01-06 (×6): 10 ug via INTRAVENOUS

## 2013-01-06 MED ORDER — ZOLPIDEM TARTRATE 5 MG PO TABS
5.0000 mg | ORAL_TABLET | Freq: Every evening | ORAL | Status: DC | PRN
  Administered 2013-01-06 – 2013-01-09 (×3): 5 mg via ORAL
  Filled 2013-01-06 (×4): qty 1

## 2013-01-06 MED ORDER — KCL IN DEXTROSE-NACL 20-5-0.45 MEQ/L-%-% IV SOLN
INTRAVENOUS | Status: DC
  Administered 2013-01-07 (×2): via INTRAVENOUS
  Filled 2013-01-06 (×3): qty 1000

## 2013-01-06 MED ORDER — ROCURONIUM BROMIDE 100 MG/10ML IV SOLN
INTRAVENOUS | Status: DC | PRN
  Administered 2013-01-06: 20 mg via INTRAVENOUS
  Administered 2013-01-06: 70 mg via INTRAVENOUS
  Administered 2013-01-06 (×3): 10 mg via INTRAVENOUS
  Administered 2013-01-06: 30 mg via INTRAVENOUS

## 2013-01-06 MED ORDER — HYDROMORPHONE HCL PF 1 MG/ML IJ SOLN
0.5000 mg | INTRAMUSCULAR | Status: DC | PRN
  Administered 2013-01-06 – 2013-01-07 (×3): 1 mg via INTRAVENOUS
  Filled 2013-01-06 (×5): qty 1

## 2013-01-06 MED ORDER — BUPIVACAINE LIPOSOME 1.3 % IJ SUSP
20.0000 mL | Freq: Once | INTRAMUSCULAR | Status: DC
  Filled 2013-01-06: qty 20

## 2013-01-06 MED ORDER — FENTANYL CITRATE 0.05 MG/ML IJ SOLN: 25.0000 ug | INTRAMUSCULAR | Status: DC | PRN

## 2013-01-06 MED ORDER — MENTHOL 3 MG MT LOZG
1.0000 | LOZENGE | OROMUCOSAL | Status: DC | PRN
  Administered 2013-01-06: 3 mg via ORAL
  Filled 2013-01-06: qty 9

## 2013-01-06 MED ORDER — OXYCODONE HCL 5 MG PO TABS
5.0000 mg | ORAL_TABLET | ORAL | Status: DC | PRN
  Administered 2013-01-06 – 2013-01-08 (×8): 5 mg via ORAL
  Filled 2013-01-06 (×9): qty 1

## 2013-01-06 MED ORDER — LACTATED RINGERS IV SOLN: INTRAVENOUS | Status: DC

## 2013-01-06 MED ORDER — KETAMINE HCL 10 MG/ML IJ SOLN
INTRAMUSCULAR | Status: DC | PRN
  Administered 2013-01-06 (×5): 1 mg via INTRAVENOUS
  Administered 2013-01-06: 50 mg via INTRAVENOUS

## 2013-01-06 MED ORDER — IOHEXOL 300 MG/ML  SOLN
INTRAMUSCULAR | Status: AC
  Filled 2013-01-06: qty 1

## 2013-01-06 MED ORDER — MIDAZOLAM HCL 5 MG/5ML IJ SOLN
INTRAMUSCULAR | Status: DC | PRN
  Administered 2013-01-06: 1 mg via INTRAVENOUS
  Administered 2013-01-06: 2 mg via INTRAVENOUS
  Administered 2013-01-06: 1 mg via INTRAVENOUS

## 2013-01-06 MED ORDER — ONDANSETRON HCL 4 MG/2ML IJ SOLN
4.0000 mg | INTRAMUSCULAR | Status: DC | PRN
  Administered 2013-01-06 – 2013-01-08 (×4): 4 mg via INTRAVENOUS
  Filled 2013-01-06 (×4): qty 2

## 2013-01-06 MED ORDER — DOCUSATE SODIUM 100 MG PO CAPS
100.0000 mg | ORAL_CAPSULE | Freq: Two times a day (BID) | ORAL | Status: DC
  Administered 2013-01-06 – 2013-01-09 (×6): 100 mg via ORAL
  Filled 2013-01-06 (×7): qty 1

## 2013-01-06 MED ORDER — INDIGOTINDISULFONATE SODIUM 8 MG/ML IJ SOLN
INTRAMUSCULAR | Status: AC
  Filled 2013-01-06: qty 5

## 2013-01-06 MED ORDER — PROMETHAZINE HCL 25 MG/ML IJ SOLN
6.2500 mg | INTRAMUSCULAR | Status: DC | PRN
Start: 2013-01-06 — End: 2013-01-06

## 2013-01-06 MED ORDER — FENTANYL CITRATE 0.05 MG/ML IJ SOLN
INTRAMUSCULAR | Status: AC
  Administered 2013-01-06: 100 ug
  Filled 2013-01-06: qty 2

## 2013-01-06 MED ORDER — SENNA 8.6 MG PO TABS
1.0000 | ORAL_TABLET | Freq: Two times a day (BID) | ORAL | Status: DC
  Administered 2013-01-06 – 2013-01-09 (×6): 8.6 mg via ORAL
  Filled 2013-01-06 (×6): qty 1

## 2013-01-06 MED ORDER — MEPERIDINE HCL 50 MG/ML IJ SOLN: 6.2500 mg | INTRAMUSCULAR | Status: DC | PRN

## 2013-01-06 MED ORDER — GLYCOPYRROLATE 0.2 MG/ML IJ SOLN
INTRAMUSCULAR | Status: DC | PRN
  Administered 2013-01-06: .5 mg via INTRAVENOUS
  Administered 2013-01-06: 0.2 mg via INTRAVENOUS

## 2013-01-06 SURGICAL SUPPLY — 76 items
ADAPTER CATH URET PLST 4-6FR (CATHETERS) IMPLANT
BAG URINE DRAINAGE (UROLOGICAL SUPPLIES) ×2 IMPLANT
BAG URO CATCHER STRL LF (DRAPE) ×2 IMPLANT
BASKET ZERO TIP NITINOL 2.4FR (BASKET) IMPLANT
CANISTER OMNI JUG 16 LITER (MISCELLANEOUS) ×2 IMPLANT
CANISTER SUCTION 2500CC (MISCELLANEOUS) ×2 IMPLANT
CATH FOLEY 2WAY SLVR  5CC 16FR (CATHETERS) ×1
CATH FOLEY 2WAY SLVR 5CC 16FR (CATHETERS) ×1 IMPLANT
CATH INTERMIT  6FR 70CM (CATHETERS) ×2 IMPLANT
CHLORAPREP W/TINT 26ML (MISCELLANEOUS) ×2 IMPLANT
CLIP LIGATING HEM O LOK PURPLE (MISCELLANEOUS) IMPLANT
CLIP LIGATING HEMO O LOK GREEN (MISCELLANEOUS) IMPLANT
CLOTH BEACON ORANGE TIMEOUT ST (SAFETY) ×2 IMPLANT
CORD HIGH FREQUENCY UNIPOLAR (ELECTROSURGICAL) ×2 IMPLANT
CORDS BIPOLAR (ELECTRODE) ×2 IMPLANT
COVER TIP SHEARS 8 DVNC (MISCELLANEOUS) ×1 IMPLANT
COVER TIP SHEARS 8MM DA VINCI (MISCELLANEOUS) ×1
DECANTER SPIKE VIAL GLASS SM (MISCELLANEOUS) IMPLANT
DERMABOND ADVANCED (GAUZE/BANDAGES/DRESSINGS) ×1
DERMABOND ADVANCED .7 DNX12 (GAUZE/BANDAGES/DRESSINGS) ×1 IMPLANT
DRAIN CHANNEL 15F RND FF 3/16 (WOUND CARE) ×2 IMPLANT
DRAPE CAMERA CLOSED 9X96 (DRAPES) ×2 IMPLANT
DRAPE INCISE IOBAN 66X45 STRL (DRAPES) ×2 IMPLANT
DRAPE LAPAROSCOPIC ABDOMINAL (DRAPES) ×2 IMPLANT
DRAPE LG THREE QUARTER DISP (DRAPES) ×2 IMPLANT
DRAPE TABLE BACK 44X90 PK DISP (DRAPES) ×2 IMPLANT
DRAPE UTILITY XL STRL (DRAPES) ×2 IMPLANT
DRAPE WARM FLUID 44X44 (DRAPE) ×2 IMPLANT
ELECT REM PT RETURN 9FT ADLT (ELECTROSURGICAL) ×2
ELECTRODE REM PT RTRN 9FT ADLT (ELECTROSURGICAL) ×1 IMPLANT
EVACUATOR SILICONE 100CC (DRAIN) ×2 IMPLANT
GAUZE VASELINE 3X9 (GAUZE/BANDAGES/DRESSINGS) IMPLANT
GLOVE BIOGEL M STRL SZ7.5 (GLOVE) ×6 IMPLANT
GOWN STRL NON-REIN LRG LVL3 (GOWN DISPOSABLE) ×6 IMPLANT
GOWN STRL REIN XL XLG (GOWN DISPOSABLE) ×2 IMPLANT
GUIDEWIRE ANG ZIPWIRE 038X150 (WIRE) ×2 IMPLANT
GUIDEWIRE STR DUAL SENSOR (WIRE) IMPLANT
KIT ACCESSORY DA VINCI DISP (KITS) ×1
KIT ACCESSORY DVNC DISP (KITS) ×1 IMPLANT
KIT BASIN OR (CUSTOM PROCEDURE TRAY) ×2 IMPLANT
LUBRICANT JELLY ST 5GR 8946 (MISCELLANEOUS) ×8 IMPLANT
MANIFOLD NEPTUNE II (INSTRUMENTS) IMPLANT
MARKER SKIN DUAL TIP RULER LAB (MISCELLANEOUS) ×4 IMPLANT
NS IRRIG 1000ML POUR BTL (IV SOLUTION) ×2 IMPLANT
PACK CYSTO (CUSTOM PROCEDURE TRAY) ×2 IMPLANT
PENCIL BUTTON HOLSTER BLD 10FT (ELECTRODE) ×2 IMPLANT
POSITIONER SURGICAL ARM (MISCELLANEOUS) ×4 IMPLANT
SCISSORS LAP 5X35 DISP (ENDOMECHANICALS) ×2 IMPLANT
SET IRRIG Y TYPE TUR BLADDER L (SET/KITS/TRAYS/PACK) IMPLANT
SET TUBE IRRIG SUCTION NO TIP (IRRIGATION / IRRIGATOR) ×2 IMPLANT
SOLUTION ANTI FOG 6CC (MISCELLANEOUS) ×2 IMPLANT
SOLUTION ELECTROLUBE (MISCELLANEOUS) ×2 IMPLANT
SPONGE LAP 18X18 X RAY DECT (DISPOSABLE) ×2 IMPLANT
STAPLER VISISTAT 35W (STAPLE) ×2 IMPLANT
STENT POLARIS 5FRX24 (STENTS) ×2 IMPLANT
SUT ETHILON 3 0 PS 1 (SUTURE) ×2 IMPLANT
SUT MNCRL 3 0 VIOLET RB1 (SUTURE) ×4 IMPLANT
SUT MNCRL AB 4-0 PS2 18 (SUTURE) ×4 IMPLANT
SUT MONOCRYL 3 0 RB1 (SUTURE) ×4
SUT VIC AB 0 CT1 27 (SUTURE)
SUT VIC AB 0 CT1 27XBRD ANTBC (SUTURE) IMPLANT
SUT VIC AB 0 UR5 27 (SUTURE) IMPLANT
SUT VIC AB 4-0 RB1 27 (SUTURE)
SUT VIC AB 4-0 RB1 27XBRD (SUTURE) IMPLANT
SUT VICRYL 0 UR6 27IN ABS (SUTURE) IMPLANT
SYR BULB IRRIGATION 50ML (SYRINGE) IMPLANT
TOWEL OR NON WOVEN STRL DISP B (DISPOSABLE) ×2 IMPLANT
TRAY FOLEY CATH 14FRSI W/METER (CATHETERS) IMPLANT
TRAY LAP CHOLE (CUSTOM PROCEDURE TRAY) ×2 IMPLANT
TROCAR BLADELESS OPT 5 75 (ENDOMECHANICALS) ×2 IMPLANT
TROCAR ENDOPATH XCEL 12X100 BL (ENDOMECHANICALS) ×2 IMPLANT
TROCAR XCEL 12X100 BLDLESS (ENDOMECHANICALS) ×4 IMPLANT
TUBE FEEDING 8FR 16IN STR KANG (MISCELLANEOUS) IMPLANT
TUBING CONNECTING 10 (TUBING) IMPLANT
TUBING INSUFFLATION 10FT LAP (TUBING) ×2 IMPLANT
WATER STERILE IRR 1500ML POUR (IV SOLUTION) ×4 IMPLANT

## 2013-01-06 NOTE — Transfer of Care (Signed)
Immediate Anesthesia Transfer of Care Note  Patient: Bryndon Cumbie  Procedure(s) Performed: Procedure(s): ROBOTIC ASSISTED PYELOPLASTY (Right) CYSTOSCOPY WITH RETROGRADE PYELOGRAM/URETERAL STENT PLACEMENT (Right)  Patient Location: PACU  Anesthesia Type:General  Level of Consciousness: responds to stimulation, extubated deep, ventilating well, resp rate even,  Unlabored, adequate  Airway & Oxygen Therapy: Patient Spontanous Breathing and Patient connected to face mask  Post-op Assessment: Report given to PACU RN, Post -op Vital signs reviewed and stable and Patient moving all extremities X 4  Post vital signs: Reviewed and stable  Complications: No apparent anesthesia complications

## 2013-01-06 NOTE — Anesthesia Postprocedure Evaluation (Signed)
  Anesthesia Post-op Note  Patient: Jack Becker  Procedure(s) Performed: Procedure(s) (LRB): ROBOTIC ASSISTED PYELOPLASTY (Right) CYSTOSCOPY WITH RETROGRADE PYELOGRAM/URETERAL STENT PLACEMENT (Right)  Patient Location: PACU  Anesthesia Type: General  Level of Consciousness: awake and alert   Airway and Oxygen Therapy: Patient Spontanous Breathing  Post-op Pain: mild  Post-op Assessment: Post-op Vital signs reviewed, Patient's Cardiovascular Status Stable, Respiratory Function Stable, Patent Airway and No signs of Nausea or vomiting  Last Vitals:  Filed Vitals:   01/06/13 1824  BP: 142/66  Pulse: 63  Temp: 36.7 C  Resp: 18    Post-op Vital Signs: stable   Complications: No apparent anesthesia complications

## 2013-01-06 NOTE — Progress Notes (Signed)
Offered to lock patient's wallet in security. He stated he wanted it left in locker in Short Stay and did not want it locked in security. Patient informed we are not responsible for any valuables left in Short Stay. Verbalizes understanding.

## 2013-01-06 NOTE — Anesthesia Preprocedure Evaluation (Signed)
Anesthesia Evaluation  Patient identified by MRN, date of birth, ID band Patient awake  General Assessment Comment:Right hydronephrosis from UPJ obstruction.  Reviewed: Allergy & Precautions, H&P , NPO status , Patient's Chart, lab work & pertinent test results  Airway Mallampati: II TM Distance: >3 FB Neck ROM: Full    Dental no notable dental hx.    Pulmonary neg pulmonary ROS,  breath sounds clear to auscultation  Pulmonary exam normal       Cardiovascular Exercise Tolerance: Good negative cardio ROS  Rhythm:Regular Rate:Normal     Neuro/Psych negative neurological ROS  negative psych ROS   GI/Hepatic negative GI ROS, (+)     substance abuse  alcohol use,   Endo/Other  negative endocrine ROS  Renal/GU Renal InsufficiencyRenal diseaseCr 1.5 K 4.2  negative genitourinary   Musculoskeletal negative musculoskeletal ROS (+)   Abdominal   Peds negative pediatric ROS (+)  Hematology negative hematology ROS (+)   Anesthesia Other Findings Upper front left cap  Reproductive/Obstetrics negative OB ROS                           Anesthesia Physical  Anesthesia Plan  ASA: II  Anesthesia Plan: General   Post-op Pain Management:    Induction: Intravenous  Airway Management Planned: Oral ETT  Additional Equipment:   Intra-op Plan:   Post-operative Plan: Extubation in OR  Informed Consent: I have reviewed the patients History and Physical, chart, labs and discussed the procedure including the risks, benefits and alternatives for the proposed anesthesia with the patient or authorized representative who has indicated his/her understanding and acceptance.   Dental advisory given  Plan Discussed with: CRNA  Anesthesia Plan Comments:         Anesthesia Quick Evaluation

## 2013-01-06 NOTE — H&P (Signed)
Jack Becker is an 37 y.o. male.    Chief Complaint: Pre-op Rt Robotic Pyeloplasty  HPI:   1 - Rt Hydro / UPJ Obstruction -  He had an episode of right flank pain about a year ago and was evaluated in [place] by Dr. Excell Seltzer and was to be scheduled for a pyeloplasty but he ended up in an inpatient treatment center in [place] and is no longer working in [place].  The pain hit 11/2012 and a CT showed a massively dilated right kidney consistent with a UPJ obstruction.  He has small bilateral stones as well.  He has had intermittant pain since 2003 but was not diagnosed until last year.  Cr at time 1.5 (was duringepisode on n/v). Underwent Rt JJ stent by Annabell Howells on 4/29 with plan for renogram to confirm funciton prior to any definitive therapy. Renogram confirmed approx 25% Rt / 75% Lt function. We have previously discussed management options including nephrectomy v. Pyeloplasty in detail and he wants to proceed with pyeloplasty.  PMH otherwise unremarkable. No prior surgeries. NO CV disease.  Today Zamier is seen to proceed with robotic pyeloplasty. He has tolerated both ureteral stent (stent colic) and lack of ureteral stent (obstructive pain) poorly in the last few mos.  Past Medical History  Diagnosis Date  . History of diverticulitis of colon   . Right flank pain   . Ureteropelvic junction obstruction     RIGHT  . Hydronephrosis, right   . Renal calculi     BILATERAL NON-OBSTRUCTIVE KIDNEY STONES    Past Surgical History  Procedure Laterality Date  . Cystoscopy w/ ureteral stent placement Right 12/06/2012    Procedure: CYSTOSCOPY WITH RIGHT RETROGRADE PYELOGRAM AND STENT PLACEMENT;  Surgeon: Anner Crete, MD;  Location: [facility];  Service: Urology;  Laterality: Right;    No family history on file. Social History:  reports that he has never smoked. His smokeless tobacco use includes Snuff. He reports that he does not drink alcohol or use illicit drugs.  Allergies: No  Known Allergies  No prescriptions prior to admission    Results for orders placed during the hospital encounter of 01/05/13 (from the past 48 hour(s))  SURGICAL PCR SCREEN     Status: Abnormal   Collection Time    01/05/13  9:20 AM      Result Value Range   MRSA, PCR NEGATIVE  NEGATIVE   Staphylococcus aureus POSITIVE (*) NEGATIVE   Comment:            The Xpert SA Assay (FDA     approved for NASAL specimens     in patients over 61 years of age),     is one component of     a comprehensive surveillance     program.  Test performance has     been validated by [employer] for patients greater     than or equal to 91 year old.     It is not intended     to diagnose infection nor to     guide or monitor treatment.  TYPE AND SCREEN     Status: None   Collection Time    01/05/13  9:45 AM      Result Value Range   ABO/RH(D) O POS     Antibody Screen NEG     Sample Expiration 01/19/2013    ABO/RH     Status: None   Collection Time    01/05/13  10:00 AM      Result Value Range   ABO/RH(D) O POS     No results found.  Review of Systems  Constitutional: Negative.  Negative for fever and chills.  HENT: Negative.   Eyes: Negative.   Respiratory: Negative.   Cardiovascular: Negative.   Gastrointestinal: Negative.   Genitourinary: Positive for flank pain. Negative for dysuria, urgency, frequency and hematuria.  Musculoskeletal: Negative.   Skin: Negative.   Neurological: Negative.   Endo/Heme/Allergies: Negative.   Psychiatric/Behavioral: Negative.     There were no vitals taken for this visit. Physical Exam   Assessment/Plan  1 - Rt Hydro / UPJ Obstruction - Pt with modestly preserved Rt renal funciton . Would certainly be candidate for pyeloplasty but this would require a JJ stent to be in place for 2-4 weeks. We also discussed simple nephrectomy as possibility and that would certainly be most detrimential to overall kidney function, but not require any  stents.  After consideration of options, pt has opted for Rt Robotic Pyeloplasty.  We re-discussed the natural history of UPJ obstruction with potential for future problems such as ipsilateral renal decline, nidus for recurrent infections, nidus for nephrolithiasis, chronic post-prandial back pain as well as common etiologies including lower pole crossing vessels as well as intrinsic adynamic segment / obstruction. We discussed the goals of surgical therapy including the stabilization of renal function and the avoidance of aforementioned problems, but that ipsilateral renal function would never be "normal" and may continue to show some component of delayed excretion of future studies due to chronic renal changes that have already occurred.   We then re-discussed therapies including observation, endopyelotomey (less invasive, but higher risk of recurrence), and pyeloplasty with and without robotic assistance. I showed the patient on their abdomen the approximately 4-6 incision (trocar) sites as well as possible open incision sites. We specifically re-addressed that there may be need to alter operative plans according to intraopertive findings including conversion to open as need for adjunctive procedures such as ureteral stenting, nephropexy, and pyelolithotomy. We re-discussed specific peri-operative risks including bleeding, infection, deep vein thrombosis, pulmonary embolism, compartment syndrome, neuropathy / neuropraxia, heart attack, stroke, death, as well as long-term risks such as non-cure. We re-discussed typical hospital course of approximately 2 day hospitalization, need for peri-operative drains / catheters, and typical post-hospital course with return to most non-strenuous activities by 2 weeks and ability to return to most jobs and more strenuous activity such as exercise by 6 weeks.   After this lengthy and detail discussion, including answering all of the patient's questions to their  satisfaction, they have chosen to proceed.  I also reinforced that if for any reason it appeared that pyeloplasty would not be safe or technically feasible we would perform simple nephrectomy as his overall relative renal function is borderline.  Gareth Fitzner 01/06/2013, 6:24 AM

## 2013-01-06 NOTE — Brief Op Note (Signed)
01/06/2013  4:37 PM  PATIENT:  Bishop Limbo  37 y.o. male  PRE-OPERATIVE DIAGNOSIS:  RIGHT URETEROPELVIC RIGHURETEROPELVIC   POST-OPERATIVE DIAGNOSIS:  RIGHT URETEROPELVIC [id number] OBSTRUCTION  PROCEDURE:  Procedure(s): ROBOTIC ASSISTED PYELOPLASTY (Right) CYSTOSCOPY WITH RETROGRADE PYELOGRAM/URETERAL STENT PLACEMENT (Right)  SURGEON:  Surgeon(s) and Role:    * Sebastian Ache, MD - Primary  PHYSICIAN ASSISTANT:   ASSISTANTS: Lujean Rave, PA   ANESTHESIA:   local and general  EBL:  Total I/O In: 2000 [I.V.:2000] Out: 170 [Urine:170]  BLOOD ADMINISTERED:none  DRAINS: 1 - Foley to straight drain, 2 - JP to bulb suction   LOCAL MEDICATIONS USED:  MARCAINE     SPECIMEN:  Source of Specimen:  1 - Redundtant Rt Renal Pelvis - discard  DISPOSITION OF SPECIMEN:  discard  COUNTS:  YES  TOURNIQUET:  * No tourniquets in log *  DICTATION: .Other Dictation: Dictation Number [id number]  PLAN OF CARE: Admit to inpatient   PATIENT DISPOSITION:  PACU - hemodynamically stable.   Delay start of Pharmacological VTE agent (>24hrs) due to surgical blood loss or risk of bleeding: yes

## 2013-01-07 MED ORDER — PANTOPRAZOLE SODIUM 40 MG PO TBEC
40.0000 mg | DELAYED_RELEASE_TABLET | Freq: Every day | ORAL | Status: DC
  Administered 2013-01-07 – 2013-01-09 (×3): 40 mg via ORAL
  Filled 2013-01-07 (×3): qty 1

## 2013-01-07 MED ORDER — KETOROLAC TROMETHAMINE 30 MG/ML IJ SOLN
30.0000 mg | Freq: Four times a day (QID) | INTRAMUSCULAR | Status: DC
  Administered 2013-01-07 – 2013-01-09 (×7): 30 mg via INTRAVENOUS
  Filled 2013-01-07 (×9): qty 1

## 2013-01-07 MED ORDER — KETOROLAC TROMETHAMINE 30 MG/ML IJ SOLN
30.0000 mg | Freq: Once | INTRAMUSCULAR | Status: AC
  Administered 2013-01-07: 30 mg via INTRAVENOUS
  Filled 2013-01-07: qty 1

## 2013-01-07 MED ORDER — HYDROMORPHONE HCL PF 1 MG/ML IJ SOLN
1.0000 mg | INTRAMUSCULAR | Status: DC | PRN
  Administered 2013-01-07: 2 mg via INTRAVENOUS
  Administered 2013-01-07 (×3): 1 mg via INTRAVENOUS
  Administered 2013-01-07: 2 mg via INTRAVENOUS
  Administered 2013-01-07 (×2): 1 mg via INTRAVENOUS
  Administered 2013-01-07 – 2013-01-08 (×6): 2 mg via INTRAVENOUS
  Administered 2013-01-08: 1 mg via INTRAVENOUS
  Administered 2013-01-08 (×2): 2 mg via INTRAVENOUS
  Filled 2013-01-07: qty 2
  Filled 2013-01-07: qty 1
  Filled 2013-01-07 (×11): qty 2
  Filled 2013-01-07: qty 1

## 2013-01-07 MED ORDER — ACETAMINOPHEN 500 MG PO TABS
1000.0000 mg | ORAL_TABLET | Freq: Once | ORAL | Status: AC
  Administered 2013-01-07: 1000 mg via ORAL
  Filled 2013-01-07: qty 2

## 2013-01-07 NOTE — Progress Notes (Signed)
On-call MD notified of pt's uncontrolled pain. Pt describes pain as "stabbing in his right side" where the JP drain is located. Rates pain 8 out of 10 after 1mg  dilaudid. New orders received, will continue to monitor.  Jack Becker. Clelia Croft, RN

## 2013-01-07 NOTE — Progress Notes (Signed)
PHARMACIST - PHYSICIAN COMMUNICATION DR:  Berneice Heinrich CONCERNING: Acetaminophen IV to Oral Route Change Policy  The patient is receiving Acetaminophen by the intravenous route. Based on criteria approved by the Pharmacy and Therapeutics Committee and the Medical Executive Committee, the medication is being converted to the equivalent oral dose form.   These criteria include:  -No Active GI bleeding  -Able to tolerate diet of full liquids (or better) or tube feeding  -Able to tolerate other medications by the oral or enteral route   If you have any questions about this conversion, please contact the Pharmacy Department (ext [phone]). Thank you.  Loralee Pacas, PharmD, BCPS 01/07/2013 11:14 AM

## 2013-01-07 NOTE — Progress Notes (Signed)
Pt still anxious about pain levels and amt JP drain is putting out.  Dr Mena Goes made aware, see new orders.

## 2013-01-07 NOTE — Progress Notes (Signed)
Pt refusing to walk tonight, stating he "feels like crap". Pt states his pain level is an 8 but he does not want more pain medication d/t being nauseated. Pt quickly falls asleep after being administered pain medication. Pt given zofran. Pt did agree to dangle on the side of bed and this made him feel more nauseous.  Will continue to educate patient and encourage to walk. Pt is working with incentive spirometer and wearing SCD's.

## 2013-01-07 NOTE — Progress Notes (Signed)
Patient ID: Jack Becker, male   DOB: 1975/08/30, 37 y.o.   MRN: [id number]  Pt denies CP or SOB. No flatus. Pain in right abdomen and right shoulder. Urine "cola" colored but no clots. Voiding well. Hasn't ambulated much. On clears.   Filed Vitals:   01/07/13 0633  BP: 111/46  Pulse: 66  Temp: 98.4 F (36.9 C)  Resp: 18    Intake/Output Summary (Last 24 hours) at 01/07/13 0734 Last data filed at 01/07/13 4098  Gross per 24 hour  Intake 5253.75 ml  Output   1512 ml  Net 3741.75 ml   JP 40 cc last shift, in 3 hours there's 30 - 40 ml more in drain  NAD AxOx3 CV -RRR, HR slow Lungs - normal effort, depth Abd- soft, +BS, ND, mild RUQ tender as expected Ext - no pain or swelling Foley removed  POD#1 robot-lap Rt pyeloplasty - -ambulate -IS -JP for Cr -advance diet if tolerates clears

## 2013-01-07 NOTE — Op Note (Signed)
NAMECHASKA, HAGGER NO.:  [id number]  MEDICAL RECORD NO.:  [id number]  LOCATION:  1422                         FACILITY:  [facility]  PHYSICIAN:  Sebastian Ache, MD     DATE OF BIRTH:  09-11-1975  DATE OF PROCEDURE:  01/06/2013 DATE OF DISCHARGE:                              OPERATIVE REPORT   DIAGNOSES:  Right ureteropelvic junction obstruction, recurrent right flank pain.  PROCEDURE: 1. Cystoscopy with right retrograde pyelogram and interpretation. 2. Insertion of right ureteral stent, 6 x 24 Polaris, no tether. 3. Robotic-assisted laparoscopic right dismembered pyeloplasty     With substraction of redundant renal pelvis.  ASSISTANT:  Pecola Leisure, PA  FINDINGS: 1. Apparent crossing vessel of the lower pole corresponding to the     area of step-off and obstruction. 2. Very redundant right renal pelvis, approximately 10 cm2 tissue     removed.  DRAIN: 1. Jackson-Pratt drain to bulb suction. 2. Foley catheter straight drain.  INDICATION:  Mr. Dentler is a pleasant 37 year old gentleman with history of recurrent right flank pain for number of years.  He underwent initial evaluation approximately year and a half ago at referring facility, where he was found to have right UPJ obstruction, however, he did not have repair at that time.  He re-presented to our institution with recurrent right flank pain and was found on workup to have right UPJ obstruction.  He underwent a trial of stenting by my colleague, Dr. Annabell Howells and had renogram performed  which revealed approximately 25% preserved right renal function.  Options were discussed including nephrectomy versus pyeloplasty versus chronic stenting versus observation and the patient wanted to proceed with pyeloplasty. Informed consent was obtained and placed in medical record.  Notably, the patient tolerates stents very well.  His previous stent had been removed in the office several weeks ago to give the  patient stent free interval in preparation for surgery today.  PROCEDURE IN DETAIL:  The patient being Jamael Hoffmann, was verified. Procedure being right robotic pyeloplasty was confirmed.  Procedure was carried out.  Time-out was performed.  Intravenous antibiotics were administered.  General endotracheal anesthesia was introduced.  The patient was placed into a low lithotomy position.  Sterile field was created by prepping and draping the patient's penis, perineum, and proximal thighs using iodine x3.  Next, cystourethroscopy was performed using a 22-French rigid cystoscope with 12 degree offset lens. Inspection of the anterior and posterior urethra was unremarkable. Inspection of urinary bladder revealed no diverticula, calcifications, papillary lesions.  The right ureteral orifice was cannulated with a 6- French end-hole catheter and right retrograde pyelogram was obtained.  Right retrograde pyelogram demonstrated a single right ureter single system right kidney.  There was massive hydronephrosis without ureteral Nephrosis. There was apparent step-off at the UPJ consistent with UPJ obstruction.  A 0.038 Glidewire was advanced at the level of renal pelvis, which straightened the ureter considerably over which a new 8 x 24 Polaris stent was carefully placed.  Good proximal curl in distal position was confirmed.  The cystoscope was exchanged for a 16-French Foley catheter per urethra to straight drain, 10 mL of sterile water in the balloon.  The patient was  repositioned to the right side up with full flank position applying 15 degrees table flexion and superior arm elevator, axillary roll, sequential pressure devices, bottom leg bent, top leg straight.  He was further fashioned to the table using beanbag and 3 inch tape over padding.  The sterile field was created by prepping and draping the patient's entire right flank and the abdomen using chlorhexidine gluconate.  Next a high-flow  low pressure pneumoperitoneum was obtained using Veress technique in the right lower quadrant having passed the aspiration and drop test.  A 12-mm robotic camera port was then placed in the paramedian location, approximately 3 fingerbreadths superolateral to the umbilicus.  Laparoscopic examination of peritoneal cavity revealed no visceral injury, no significant adhesions. Additional ports were placed as follows; a right subcostal 8-mm robotic port, an 8 mm far lateral right robotic port approximately 4 fingerbreadths superior and medial to the anterior superior iliac spine, an 8 mm robotic port in paramedian location approximately 4 fingerbreadths superior to the pubic ramus, a 5 mm subxiphoid liver retraction port, and a 12 mm midline assistant port between the area of the camera port and superior robotic port.  Robot was docked and passed through the electronic checks.  Next, attention was directed to development of retroperitoneum.  Incision made lateral to the ascending colon from the area of the hepatic flexure towards the area of the internal ring, and carefully swept medially and the liver was placed on very gentle superior traction using locking grasper across the abdomen.  The area of the lower pole of kidney was identified and placed on gentle lateral traction.  The ureter was identified just medial to this and was a normal caliber.  This was traced superiorly.  There was an immediate step-off with significant amount of inflammatory tissue around a very dilated renal pelvis.  This was carefully mobilized towards the area of the hilum, which consisted of single artery single vein right renovascular anatomy at that level and the area of the UPJ was further carefully dissected layer by layer and indeed a lower pole crossing vessel was encountered.  This appeared to be an accessory artery and vein.  Photograph amd video were obtained documenting this and it was felt that  dismembered pyeloplasty along with subtraction pyeloplasty reducing redimdamt renal pelvis would be the optimal means of repair.  As such, the proximal ureter was cut in a spatulating fashion and the UPJ was repositioned anteriorly into the crossing vessel. The most dependent portion of the renal pelvis was identified.  An incision was made from the previous UPJ to this location and posterior anchor stich was placed at this part encompassing the lateral ureter and the lateral aspect of the neo-UPJ. Redundant renal pelvis was then excised, total volume approximately 10 cm2 and set aside for discard which resulted in a more anatomically correct renal pelvis in terms of dependency and size.  The ureteral anastomosis was completed first on the back wall using running 3-0 Monocryl and separate suture line of running 3-0 Monocryl anteriorly.  The previous renal pelvis incision was then closed using running 3-0 Monocryl, which resulted in excellent watertight reconstruction of the UPJ and right renal pelvis and orientation that appears crossing vessel was no longer strangulating.  The retroperitoneum was loosely reapproximated using 5 mm clips.  Close suction drain was brought to the level of peritoneal cavity.  All sponge and needle counts were correct.  Robot was undocked. The previous 12-mm camera incision port sites were closed at level  of fascia using figure-of-eight 0 Vicryl.  All skin incisions were reapproximated using subcuticular Monocryl followed by Dermabond.  Drain stitch was applied.  Procedure was then terminated.  The patient tolerated the procedure well.  There were no immediate periprocedural complications.  The patient was taken to postanesthesia care unit in stable condition.          ______________________________ Sebastian Ache, MD    TM/MEDQ  D:  01/06/2013  T:  01/07/2013  Job:  [id number]

## 2013-01-08 MED ORDER — OXYCODONE-ACETAMINOPHEN 5-325 MG PO TABS
1.0000 | ORAL_TABLET | ORAL | Status: DC | PRN
  Administered 2013-01-08 – 2013-01-09 (×6): 2 via ORAL
  Filled 2013-01-08 (×6): qty 2

## 2013-01-08 NOTE — Progress Notes (Addendum)
Pt NEVER rates his painless than a 5 this shift even though we continue medicated him frequently with po and iv pain medications.  Mostly rates his pain as a 7 Right flank, sharp and constant.  Day shift reported same.  Although, has been noted to be sleeping with a light snoring.  In the first 8 hours of night shift pt had 240 CC of dark, bloody red drainage from JP.  Urine remains tea colored.  Abd is soft c +BS but has not had a post op stool and only small amounts of flatus, VSS.  Pt has been encouraged to ambulate at least QID but so far has only walked to BR and back.  Is DB&C and doing IS

## 2013-01-08 NOTE — Progress Notes (Signed)
Pt still rates pain no lower than 4, but has obviously been more comfortable today, less anxious, has ambulated in hall, been up to chair.  Urine is amber w/much less blood than this am and JP drainage is much less too

## 2013-01-08 NOTE — Progress Notes (Signed)
Patient ID: Jack Becker, male   DOB: 1976/01/02, 37 y.o.   MRN: [id number]  Pt complains of some pain and nausea when ambulating. Pt is passing flatus.   Filed Vitals:   01/08/13 0654  BP: 117/48  Pulse: 71  Temp: 98.4 F (36.9 C)  Resp: 20    Intake/Output Summary (Last 24 hours) at 01/08/13 0920 Last data filed at 01/08/13 0654  Gross per 24 hour  Intake 1761.25 ml  Output   2045 ml  Net -283.75 ml   JP 350 over past 12 hrs + 50 ml in bulb over past 2 hours  JP Cr 18 yesterday  NAD CV - rrr Lungs - CTAB Abd - soft, NT, ND, flat, good bowel sounds.  JP -right serosanguineous    Imp - POD#2 right lap pyeloplasty with extensive renal pelvic reconstruction - looks like UOP about 10% JP and 90% voids.   Plan - -change to percocet -SLIV -ambulate -monitor JP and UOP - no foley -encourage po intake

## 2013-01-08 NOTE — Progress Notes (Signed)
Just urinated 350 cc's dark redish brown urine - no clots noted.  Reports pain is 6 out of 10 - which is an improvement.  VSS.  Urine sample left for MD to assess and day rn [place] notified

## 2013-01-09 ENCOUNTER — Encounter (HOSPITAL_COMMUNITY): Payer: Self-pay | Admitting: Urology

## 2013-01-09 MED ORDER — SULFAMETHOXAZOLE-TRIMETHOPRIM 800-160 MG PO TABS: 1.0000 | ORAL_TABLET | Freq: Every day | ORAL | Status: DC

## 2013-01-09 MED ORDER — SENNA-DOCUSATE SODIUM 8.6-50 MG PO TABS: 1.0000 | ORAL_TABLET | Freq: Two times a day (BID) | ORAL | Status: DC

## 2013-01-09 MED ORDER — OXYCODONE-ACETAMINOPHEN 5-325 MG PO TABS: 1.0000 | ORAL_TABLET | Freq: Four times a day (QID) | ORAL | Status: DC | PRN

## 2013-01-09 NOTE — Progress Notes (Signed)
Much improved tonight over last night.  Pain has been managed with PO percocet an IV Toradol since HS.  JP output is still very bloody but only been 75 cc's from 8PM to 5:30AM (the past 9 1/2 hours).  Pt reported walking in halls x 4 yesterday.  No post op stool butis passing flatus and tolerating diet.

## 2013-01-09 NOTE — Discharge Summary (Signed)
Physician Discharge Summary  Patient ID: Jack Becker MRN: [id number] DOB/AGE: Jun 05, 1976 37 y.o.  Admit date: 01/06/2013 Discharge date: 01/09/2013  Admission Diagnoses: Rt Ureteropelvic Junction Obstruction  Discharge Diagnoses: Rt Ureteropelvic Junction Obstruction  Discharged Condition: good  Hospital Course:   Pt underwent Rt robotic pyeloplasty with cysto and stent placement on 5/31, the day of admission, without acute complications. He was admitted to the 4th floor urology service post-op where he began his recovery. POD 1 his foley was removed and JP output increased some, and was consistent with small volume urein leak (Cr 18 of fluid). As the proportion of JP output was small relative to total urine, it was left in place and trended down such that it was compromising approximately 10% of total urine volume by discharge. By POD 3, the day of discharge, pt was ambulatory, tolerating diet, had resumption of bowel function, was voiding to completion and felt to be adequate for discharge. His JP drain to be left  in place with daily charting of output with tentative plan for office f/u and likely removal this week.   Consults: None  Significant Diagnostic Studies: labs: JP Cr 18  Treatments: surgery: Rt robotic pyeloplasty with cysto and stent placement on 5/31  Discharge Exam: Blood pressure 109/51, pulse 71, temperature 98.5 F (36.9 C), temperature source Oral, resp. rate 18, height 5\' 8"  (1.727 m), weight 81.3 kg (179 lb 3.7 oz), SpO2 99.00%. General appearance: alert, cooperative and appears stated age Head: Normocephalic, without obvious abnormality, atraumatic Eyes: conjunctivae/corneas clear. PERRL, EOM's intact. Fundi benign. Ears: normal TM's and external ear canals both ears Nose: Nares normal. Septum midline. Mucosa normal. No drainage or sinus tenderness. Throat: lips, mucosa, and tongue normal; teeth and gums normal Neck: no adenopathy, no carotid bruit, no JVD,  supple, symmetrical, trachea midline and thyroid not enlarged, symmetric, no tenderness/mass/nodules Back: symmetric, no curvature. ROM normal. No CVA tenderness. Resp: clear to auscultation bilaterally Chest wall: no tenderness Cardio: regular rate and rhythm, S1, S2 normal, no murmur, click, rub or gallop GI: soft, non-tender; bowel sounds normal; no masses,  no organomegaly Male genitalia: normal Extremities: extremities normal, atraumatic, no cyanosis or edema Pulses: 2+ and symmetric Skin: Skin color, texture, turgor normal. No rashes or lesions Lymph nodes: Cervical, supraclavicular, and axillary nodes normal. Neurologic: Grossly normal Incision/Wound: Recent port sites c/d/i. JP  with serosanguinous fluid.  Disposition: 01-Home or Self Care     Medication List    STOP taking these medications       ketorolac 10 MG tablet  Commonly known as:  TORADOL      TAKE these medications       multivitamin with minerals Tabs  Take 1 tablet by mouth daily.     oxyCODONE-acetaminophen 5-325 MG per tablet  Commonly known as:  PERCOCET  Take 1-2 tablets by mouth every 6 (six) hours as needed for pain.     phenazopyridine 100 MG tablet  Commonly known as:  PYRIDIUM  Take 200 mg by mouth 3 (three) times daily as needed for pain.           Follow-up Information   Follow up with Sebastian Ache, MD On 01/20/2013. (at 3:45)    Contact information:   [address] [place] [address] [phone]       Signed: Sebastian Ache 01/09/2013, 6:17 AM

## 2013-01-24 ENCOUNTER — Other Ambulatory Visit: Payer: Self-pay | Admitting: Urology

## 2013-01-26 ENCOUNTER — Encounter (HOSPITAL_BASED_OUTPATIENT_CLINIC_OR_DEPARTMENT_OTHER): Payer: Self-pay | Admitting: *Deleted

## 2013-02-02 NOTE — Progress Notes (Signed)
NPO AFTER MN. ARRIVES AT 0600. NEEDS HG.

## 2013-02-03 ENCOUNTER — Ambulatory Visit (HOSPITAL_BASED_OUTPATIENT_CLINIC_OR_DEPARTMENT_OTHER)
Admission: RE | Admit: 2013-02-03 | Discharge: 2013-02-03 | Disposition: A | Discharge status: Home / Self Care | Payer: 59 | Source: Ambulatory Visit | Attending: Urology | Admitting: Urology

## 2013-02-03 ENCOUNTER — Encounter (HOSPITAL_BASED_OUTPATIENT_CLINIC_OR_DEPARTMENT_OTHER): Payer: Self-pay | Admitting: Anesthesiology

## 2013-02-03 ENCOUNTER — Encounter (HOSPITAL_BASED_OUTPATIENT_CLINIC_OR_DEPARTMENT_OTHER)
Admission: RE | Disposition: A | Discharge status: Home / Self Care | Payer: Self-pay | Source: Ambulatory Visit | Attending: Urology

## 2013-02-03 ENCOUNTER — Ambulatory Visit (HOSPITAL_BASED_OUTPATIENT_CLINIC_OR_DEPARTMENT_OTHER): Payer: 59 | Admitting: Anesthesiology

## 2013-02-03 ENCOUNTER — Encounter (HOSPITAL_BASED_OUTPATIENT_CLINIC_OR_DEPARTMENT_OTHER): Payer: Self-pay | Admitting: *Deleted

## 2013-02-03 DIAGNOSIS — IMO0002 Reserved for concepts with insufficient information to code with codable children: Secondary | ICD-10-CM | POA: Insufficient documentation

## 2013-02-03 DIAGNOSIS — Z79899 Other long term (current) drug therapy: Secondary | ICD-10-CM | POA: Insufficient documentation

## 2013-02-03 DIAGNOSIS — N135 Crossing vessel and stricture of ureter without hydronephrosis: Secondary | ICD-10-CM | POA: Insufficient documentation

## 2013-02-03 DIAGNOSIS — F1010 Alcohol abuse, uncomplicated: Secondary | ICD-10-CM | POA: Insufficient documentation

## 2013-02-03 DIAGNOSIS — F17200 Nicotine dependence, unspecified, uncomplicated: Secondary | ICD-10-CM | POA: Insufficient documentation

## 2013-02-03 DIAGNOSIS — Y838 Other surgical procedures as the cause of abnormal reaction of the patient, or of later complication, without mention of misadventure at the time of the procedure: Secondary | ICD-10-CM | POA: Insufficient documentation

## 2013-02-03 DIAGNOSIS — N9989 Other postprocedural complications and disorders of genitourinary system: Secondary | ICD-10-CM | POA: Insufficient documentation

## 2013-02-03 DIAGNOSIS — Z87718 Personal history of other specified (corrected) congenital malformations of genitourinary system: Secondary | ICD-10-CM | POA: Insufficient documentation

## 2013-02-03 DIAGNOSIS — N289 Disorder of kidney and ureter, unspecified: Secondary | ICD-10-CM | POA: Insufficient documentation

## 2013-02-03 DIAGNOSIS — Z87442 Personal history of urinary calculi: Secondary | ICD-10-CM | POA: Insufficient documentation

## 2013-02-03 HISTORY — DX: Congenital occlusion of ureteropelvic junction: Q62.11

## 2013-02-03 HISTORY — PX: CYSTOSCOPY W/ URETERAL STENT REMOVAL: SHX1430

## 2013-02-03 HISTORY — DX: Other obstructive defects of renal pelvis and ureter: Q62.39

## 2013-02-03 LAB — POCT I-STAT, CHEM 8
Calcium, Ion: 1.23 mmol/L (ref 1.12–1.23)
Chloride: 107 mEq/L (ref 96–112)
Glucose, Bld: 93 mg/dL (ref 70–99)
HCT: 37 % — ABNORMAL LOW (ref 39.0–52.0)

## 2013-02-03 SURGERY — REMOVAL, STENT, URETER, CYSTOSCOPIC
Anesthesia: General | Site: Ureter | Laterality: Right | Wound class: Clean Contaminated

## 2013-02-03 MED ORDER — STERILE WATER FOR IRRIGATION IR SOLN
Status: DC | PRN
  Administered 2013-02-03: 3000 mL

## 2013-02-03 MED ORDER — PROMETHAZINE HCL 25 MG/ML IJ SOLN
6.2500 mg | INTRAMUSCULAR | Status: DC | PRN
  Filled 2013-02-03: qty 1

## 2013-02-03 MED ORDER — OXYCODONE HCL 5 MG/5ML PO SOLN
5.0000 mg | Freq: Once | ORAL | Status: DC | PRN
  Filled 2013-02-03: qty 5

## 2013-02-03 MED ORDER — ACETAMINOPHEN 10 MG/ML IV SOLN
1000.0000 mg | Freq: Once | INTRAVENOUS | Status: DC | PRN
  Filled 2013-02-03: qty 100

## 2013-02-03 MED ORDER — GENTAMICIN IN SALINE 1.6-0.9 MG/ML-% IV SOLN
80.0000 mg | INTRAVENOUS | Status: DC
  Filled 2013-02-03: qty 50

## 2013-02-03 MED ORDER — OXYCODONE-ACETAMINOPHEN 5-325 MG PO TABS: 1.0000 | ORAL_TABLET | ORAL | Status: DC | PRN

## 2013-02-03 MED ORDER — LACTATED RINGERS IV SOLN
INTRAVENOUS | Status: DC
  Administered 2013-02-03: 100 mL/h via INTRAVENOUS
  Filled 2013-02-03: qty 1000

## 2013-02-03 MED ORDER — MIDAZOLAM HCL 5 MG/5ML IJ SOLN
INTRAMUSCULAR | Status: DC | PRN
  Administered 2013-02-03: 2 mg via INTRAVENOUS

## 2013-02-03 MED ORDER — MEPERIDINE HCL 25 MG/ML IJ SOLN
6.2500 mg | INTRAMUSCULAR | Status: DC | PRN
  Filled 2013-02-03: qty 1

## 2013-02-03 MED ORDER — LIDOCAINE HCL (CARDIAC) 20 MG/ML IV SOLN
INTRAVENOUS | Status: DC | PRN
  Administered 2013-02-03: 100 mg via INTRAVENOUS

## 2013-02-03 MED ORDER — KETOROLAC TROMETHAMINE 30 MG/ML IJ SOLN
INTRAMUSCULAR | Status: DC | PRN
  Administered 2013-02-03: 30 mg via INTRAVENOUS

## 2013-02-03 MED ORDER — GENTAMICIN SULFATE 40 MG/ML IJ SOLN
400.0000 mg | INTRAVENOUS | Status: AC
  Administered 2013-02-03: 400 mg via INTRAVENOUS
  Filled 2013-02-03: qty 10

## 2013-02-03 MED ORDER — PROPOFOL 10 MG/ML IV BOLUS
INTRAVENOUS | Status: DC | PRN
  Administered 2013-02-03: 250 mg via INTRAVENOUS

## 2013-02-03 MED ORDER — FENTANYL CITRATE 0.05 MG/ML IJ SOLN
INTRAMUSCULAR | Status: DC | PRN
  Administered 2013-02-03: 50 ug via INTRAVENOUS
  Administered 2013-02-03 (×3): 25 ug via INTRAVENOUS
  Administered 2013-02-03: 50 ug via INTRAVENOUS
  Administered 2013-02-03: 25 ug via INTRAVENOUS

## 2013-02-03 MED ORDER — ONDANSETRON HCL 4 MG/2ML IJ SOLN
INTRAMUSCULAR | Status: DC | PRN
  Administered 2013-02-03: 4 mg via INTRAVENOUS

## 2013-02-03 MED ORDER — DEXAMETHASONE SODIUM PHOSPHATE 4 MG/ML IJ SOLN
INTRAMUSCULAR | Status: DC | PRN
  Administered 2013-02-03: 10 mg via INTRAVENOUS

## 2013-02-03 MED ORDER — LACTATED RINGERS IV SOLN
INTRAVENOUS | Status: DC | PRN
  Administered 2013-02-03: 07:00:00 via INTRAVENOUS

## 2013-02-03 MED ORDER — SENNA-DOCUSATE SODIUM 8.6-50 MG PO TABS: 1.0000 | ORAL_TABLET | Freq: Two times a day (BID) | ORAL | Status: DC

## 2013-02-03 MED ORDER — HYDROMORPHONE HCL PF 1 MG/ML IJ SOLN
0.2500 mg | INTRAMUSCULAR | Status: DC | PRN
  Filled 2013-02-03: qty 1

## 2013-02-03 MED ORDER — OXYCODONE HCL 5 MG PO TABS
5.0000 mg | ORAL_TABLET | Freq: Once | ORAL | Status: DC | PRN
  Filled 2013-02-03: qty 1

## 2013-02-03 SURGICAL SUPPLY — 20 items
BAG URO CATCHER STRL LF (DRAPE) ×2 IMPLANT
BASKET LASER NITINOL 1.9FR (BASKET) IMPLANT
BASKET ZERO TIP NITINOL 2.4FR (BASKET) IMPLANT
CATH FOLEY 2WAY  3CC  8FR (CATHETERS)
CATH FOLEY 2WAY 3CC 8FR (CATHETERS) IMPLANT
CATH INTERMIT  6FR 70CM (CATHETERS) IMPLANT
CLOTH BEACON ORANGE TIMEOUT ST (SAFETY) ×2 IMPLANT
DRAPE CAMERA CLOSED 9X96 (DRAPES) ×2 IMPLANT
GLOVE BIO SURGEON STRL SZ7 (GLOVE) ×2 IMPLANT
GLOVE ECLIPSE 6.5 STRL STRAW (GLOVE) ×2 IMPLANT
GLOVE INDICATOR 6.5 STRL GRN (GLOVE) ×2 IMPLANT
GOWN PREVENTION PLUS XLARGE (GOWN DISPOSABLE) ×2 IMPLANT
GOWN STRL NON-REIN LRG LVL3 (GOWN DISPOSABLE) ×2 IMPLANT
GUIDEWIRE ANG ZIPWIRE 038X150 (WIRE) IMPLANT
GUIDEWIRE STR DUAL SENSOR (WIRE) IMPLANT
IV NS IRRIG 3000ML ARTHROMATIC (IV SOLUTION) IMPLANT
PACK CYSTOSCOPY (CUSTOM PROCEDURE TRAY) ×2 IMPLANT
SYRINGE 10CC LL (SYRINGE) IMPLANT
TUBE FEEDING 8FR 16IN STR KANG (MISCELLANEOUS) IMPLANT
WATER STERILE IRR 3000ML UROMA (IV SOLUTION) ×2 IMPLANT

## 2013-02-03 NOTE — Transfer of Care (Signed)
Immediate Anesthesia Transfer of Care Note  Patient: Jack Becker  Procedure(s) Performed: Procedure(s) (LRB): CYSTOSCOPY WITH RIGHT STENT REMOVAL (Right)  Patient Location: PACU  Anesthesia Type: General  Level of Consciousness: awake, sedated, patient cooperative and responds to stimulation  Airway & Oxygen Therapy: Patient Spontanous Breathing and Patient connected to face mask oxygen  Post-op Assessment: Report given to PACU RN, Post -op Vital signs reviewed and stable and Patient moving all extremities  Post vital signs: Reviewed and stable  Complications: No apparent anesthesia complications

## 2013-02-03 NOTE — Anesthesia Postprocedure Evaluation (Signed)
Anesthesia Post Note  Patient: Jack Becker  Procedure(s) Performed: Procedure(s) (LRB): CYSTOSCOPY WITH RIGHT STENT REMOVAL (Right)  Anesthesia type: General  Patient location: PACU  Post pain: Pain level controlled  Post assessment: Post-op Vital signs reviewed  Last Vitals: BP 120/69  Pulse 60  Temp(Src) 36.4 C (Oral)  Resp 13  Ht 5\' 7"  (1.702 m)  Wt 179 lb 8 oz (81.421 kg)  BMI 28.11 kg/m2  SpO2 100%  Post vital signs: Reviewed  Level of consciousness: sedated  Complications: No apparent anesthesia complications

## 2013-02-03 NOTE — H&P (Signed)
Jack Becker is an 37 y.o. male.    Chief Complaint: Pre-Op Cysto Rt Stent Pull  HPI:   1 - Rt UPJ Obstruction / Retained Rt Ureteral Stent-  s/p Rt robotic dismembered pyeloplasty and subtraction of redundant renal pelvis on 01/06/13. Had lower pole crossing vessel. Pre-op Renogram confirmed approx 25% Rt / 75% Lt function and very redundant Rt renal pelvis.  He had an 8x24 polaris stent placed at pyeloplasty as well as he tollerates JJ stents and office cysto very poorly.   PMH otherwise unremarkable. No prior surgeries. NO CV disease.  Today Jack Becker is seen for f/u cysto stent pull. No recent fevers. Most recent UA without infectious parameters.   Past Medical History  Diagnosis Date  . History of diverticulitis of colon   . Right flank pain   . Renal calculi     BILATERAL NON-OBSTRUCTIVE KIDNEY STONES  . Congenital ureteropelvic junction obstruction     RIGHT SIDE    Past Surgical History  Procedure Laterality Date  . Cystoscopy w/ ureteral stent placement Right 12/06/2012    Procedure: CYSTOSCOPY WITH RIGHT RETROGRADE PYELOGRAM AND STENT PLACEMENT;  Surgeon: Anner Crete, MD;  Location: [facility];  Service: Urology;  Laterality: Right;  . Robot assisted pyeloplasty Right 01/06/2013    Procedure: ROBOTIC ASSISTED PYELOPLASTY;  Surgeon: Sebastian Ache, MD;  Location: [facility];  Service: Urology;  Laterality: Right;  . Cystoscopy w/ ureteral stent placement Right 01/06/2013    Procedure: CYSTOSCOPY WITH RETROGRADE PYELOGRAM/URETERAL STENT PLACEMENT;  Surgeon: Sebastian Ache, MD;  Location: [facility];  Service: Urology;  Laterality: Right;    No family history on file. Social History:  reports that he has never smoked. His smokeless tobacco use includes Snuff. He reports that he does not drink alcohol or use illicit drugs.  Allergies: No Known Allergies  Medications Prior to Admission  Medication Sig Dispense Refill  . acetaminophen (TYLENOL) 325 MG tablet Take 650  mg by mouth every 6 (six) hours as needed for pain.      . phenazopyridine (PYRIDIUM) 100 MG tablet Take 200 mg by mouth 3 (three) times daily as needed for pain.      . Multiple Vitamin (MULTIVITAMIN WITH MINERALS) TABS Take 1 tablet by mouth daily.        No results found for this or any previous visit (from the past 48 hour(s)). No results found.  Review of Systems  Constitutional: Negative.  Negative for fever and chills.  HENT: Negative.   Eyes: Negative.   Respiratory: Negative.   Cardiovascular: Negative.   Gastrointestinal: Negative.   Genitourinary: Negative.   Musculoskeletal: Negative.   Skin: Negative.   Neurological: Negative.   Endo/Heme/Allergies: Negative.   Psychiatric/Behavioral: Negative.     Height 5\' 8"  (1.727 m), weight 81.194 kg (179 lb). Physical Exam  Constitutional: He is oriented to person, place, and time. He appears well-developed and well-nourished.  HENT:  Head: Normocephalic and atraumatic.  Eyes: EOM are normal. Pupils are equal, round, and reactive to light.  Neck: Normal range of motion. Neck supple.  Cardiovascular: Normal rate and regular rhythm.   Respiratory: Effort normal.  GI: Soft. Bowel sounds are normal.  Recent port sites c/d/i without hernias  Genitourinary: Penis normal.  Musculoskeletal: Normal range of motion.  Neurological: He is alert and oriented to person, place, and time.  Skin: Skin is warm and dry.  Psychiatric: He has a normal mood and affect. His behavior is normal. Judgment and thought content  normal.     Assessment/Plan  1 - Rt UPJ Obstruction / Retained Rt Ureteral Stent-  Proceed with stent pull today under anesthesia.   We discussed anesthetic risks and rare but serious surgical complications including DVT, PE, MI, and mortality. We specifically addressed that although pyleoplasty is the "gold standard" for UPJ repair recurrence is possible and warned against recurrent flank pain following stent pull. He  voiced understanding and wishes to proceed.   Jack Becker 02/03/2013, 6:08 AM

## 2013-02-03 NOTE — Anesthesia Procedure Notes (Signed)
Procedure Name: LMA Insertion Date/Time: 02/03/2013 7:32 AM Performed by: Jessica Priest Pre-anesthesia Checklist: Patient identified, Emergency Drugs available, Suction available and Patient being monitored Patient Re-evaluated:Patient Re-evaluated prior to inductionOxygen Delivery Method: Circle System Utilized Preoxygenation: Pre-oxygenation with 100% oxygen Intubation Type: IV induction Ventilation: Mask ventilation without difficulty LMA: LMA inserted LMA Size: 4.0 Number of attempts: 1 Airway Equipment and Method: bite block Placement Confirmation: positive ETCO2 Tube secured with: Tape Dental Injury: Teeth and Oropharynx as per pre-operative assessment

## 2013-02-03 NOTE — Anesthesia Preprocedure Evaluation (Signed)
Anesthesia Evaluation  Patient identified by MRN, date of birth, ID band Patient awake  General Assessment Comment:Right hydronephrosis from UPJ obstruction.  Reviewed: Allergy & Precautions, H&P , NPO status , Patient's Chart, lab work & pertinent test results  Airway Mallampati: II TM Distance: >3 FB Neck ROM: Full    Dental no notable dental hx. (+) Dental Advisory Given   Pulmonary neg pulmonary ROS,  breath sounds clear to auscultation  Pulmonary exam normal       Cardiovascular Exercise Tolerance: Good negative cardio ROS  Rhythm:Regular Rate:Normal     Neuro/Psych negative neurological ROS  negative psych ROS   GI/Hepatic negative GI ROS, (+)     substance abuse  alcohol use,   Endo/Other  negative endocrine ROS  Renal/GU Renal InsufficiencyRenal diseaseCr 1.5 K 4.2     Musculoskeletal negative musculoskeletal ROS (+)   Abdominal   Peds  Hematology negative hematology ROS (+)   Anesthesia Other Findings Upper front left cap  Reproductive/Obstetrics                           Anesthesia Physical  Anesthesia Plan  ASA: II  Anesthesia Plan: General   Post-op Pain Management:    Induction: Intravenous  Airway Management Planned: LMA  Additional Equipment:   Intra-op Plan:   Post-operative Plan: Extubation in OR  Informed Consent: I have reviewed the patients History and Physical, chart, labs and discussed the procedure including the risks, benefits and alternatives for the proposed anesthesia with the patient or authorized representative who has indicated his/her understanding and acceptance.   Dental advisory given  Plan Discussed with: CRNA  Anesthesia Plan Comments:         Anesthesia Quick Evaluation

## 2013-02-03 NOTE — Brief Op Note (Signed)
02/03/2013  7:47 AM  PATIENT:  Jack Becker  37 y.o. male  PRE-OPERATIVE DIAGNOSIS:  Retained Right Ureteral Stent, Congenital Obstruction of UreteroPelvic Junction of Right Side  POST-OPERATIVE DIAGNOSIS:  Retained Right Ureteral Stent, Congenital Obstruction of UreteroPelvic Junction of Right Side  PROCEDURE:  Procedure(s) with comments: CYSTOSCOPY WITH RIGHT STENT REMOVAL (Right) - 30 mins req for this case  RIGHT STENT PULL  [phone] CELL # [facility]  GRP # [id number] ID # [id number]  SURGEON:  Surgeon(s) and Role:    * Sebastian Ache, MD - Primary  PHYSICIAN ASSISTANT:   ASSISTANTS: none   ANESTHESIA:   general  EBL:     BLOOD ADMINISTERED:none  DRAINS: none   LOCAL MEDICATIONS USED:  NONE  SPECIMEN:  No Specimen  DISPOSITION OF SPECIMEN:  N/A  COUNTS:  YES  TOURNIQUET:  * No tourniquets in log *  DICTATION: .Other Dictation: Dictation Number [id number]  PLAN OF CARE: Discharge to home after PACU  PATIENT DISPOSITION:  PACU - hemodynamically stable.   Delay start of Pharmacological VTE agent (>24hrs) due to surgical blood loss or risk of bleeding: not applicable

## 2013-02-06 NOTE — Op Note (Signed)
[name], [name] NO.:  [id number]  MEDICAL RECORD NO.:  [id number]  LOCATION:                                 FACILITY:  PHYSICIAN:  Sebastian Ache, MD     DATE OF BIRTH:  04-01-1976  DATE OF PROCEDURE:02/03/2013 DATE OF DISCHARGE:                              OPERATIVE REPORT   PREOPERATIVE DIAGNOSIS:  Retained right ureteral stent following pyeloplasty.  PROCEDURE:  Cystoscopy with right ureteral stent removal.  SPECIMENS:  Right ureteral stone for discard.  COMPLICATIONS:  None.  FINDINGS:  Unremarkable urinary bladder.  Efflux of urine from the right ureteral orifice following stent removal.  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  He underwent pyeloplasty on Jan 06, 2013, with concomitant right ureteral stenting.  The plan was for removal of this approximately 1 month later.  He has tolerated office cystoscopy very poorly in the past and wished to have this done in the operating room, and this was felt to be entirely reasonable.  Informed consent was obtained and placed in medical record.  PROCEDURE IN DETAIL:  Patient being Jack Becker, procedure being cystoscopy with right stent was confirmed.  Procedure was carried out. Time-out was performed.  Intravenous antibiotics were administered. General LMA anesthesia was introduced.  The patient was into a low lithotomy position.  Sterile field was created by prepping and draping the patient's penis, perineum, and proximal thighs using iodine x3. Next, cystourethroscopy was performed using a 22-French rigid cystoscope with 12-degree offset lens.  Inspection of urinary bladder revealed distal end of the Polaris type stent in adequate position.  No diverticula, papular lesions, or calcifications were noted.  The distal stent was grasped with cold graspers and brought out in its  entirety, set aside for discard.  Repeat inspection of the urinary bladder revealed excellent hemostasis.  No evidence of perforation and efflux of urine was noted from bilateral ureteral orifices.  Bladder was emptied per cystoscope.  Procedure was then terminated.  The patient tolerated the procedure well with no immediate periprocedural complications.  The patient was taken to postanesthesia care in stable condition.          ______________________________ Sebastian Ache, MD     TM/MEDQ  D:  02/03/2013  T:  02/03/2013  Job:  [id number]

## 2013-02-07 ENCOUNTER — Encounter (HOSPITAL_BASED_OUTPATIENT_CLINIC_OR_DEPARTMENT_OTHER): Payer: Self-pay | Admitting: Urology

## 2013-03-30 ENCOUNTER — Other Ambulatory Visit: Payer: Self-pay | Admitting: Urology

## 2013-04-25 ENCOUNTER — Encounter (HOSPITAL_BASED_OUTPATIENT_CLINIC_OR_DEPARTMENT_OTHER): Payer: Self-pay | Admitting: *Deleted

## 2013-04-28 NOTE — Progress Notes (Signed)
NPO AFTER MN. ARRIVES AT 1000. NEEDS ISTAT 8. MAY TAKE OXYCODONE IF NEEDED W/ SIPS OF WATER.

## 2013-05-03 ENCOUNTER — Ambulatory Visit (HOSPITAL_BASED_OUTPATIENT_CLINIC_OR_DEPARTMENT_OTHER): Payer: 59 | Admitting: Anesthesiology

## 2013-05-03 ENCOUNTER — Encounter (HOSPITAL_BASED_OUTPATIENT_CLINIC_OR_DEPARTMENT_OTHER)
Admission: RE | Disposition: A | Discharge status: Home / Self Care | Payer: Self-pay | Source: Ambulatory Visit | Attending: Urology

## 2013-05-03 ENCOUNTER — Ambulatory Visit (HOSPITAL_COMMUNITY): Payer: 59

## 2013-05-03 ENCOUNTER — Encounter (HOSPITAL_BASED_OUTPATIENT_CLINIC_OR_DEPARTMENT_OTHER): Payer: Self-pay

## 2013-05-03 ENCOUNTER — Encounter (HOSPITAL_BASED_OUTPATIENT_CLINIC_OR_DEPARTMENT_OTHER): Payer: Self-pay | Admitting: Anesthesiology

## 2013-05-03 ENCOUNTER — Ambulatory Visit (HOSPITAL_BASED_OUTPATIENT_CLINIC_OR_DEPARTMENT_OTHER)
Admission: RE | Admit: 2013-05-03 | Discharge: 2013-05-03 | Disposition: A | Discharge status: Home / Self Care | Payer: 59 | Source: Ambulatory Visit | Attending: Urology | Admitting: Urology

## 2013-05-03 DIAGNOSIS — F17200 Nicotine dependence, unspecified, uncomplicated: Secondary | ICD-10-CM | POA: Insufficient documentation

## 2013-05-03 DIAGNOSIS — R109 Unspecified abdominal pain: Secondary | ICD-10-CM | POA: Insufficient documentation

## 2013-05-03 DIAGNOSIS — N2889 Other specified disorders of kidney and ureter: Secondary | ICD-10-CM | POA: Insufficient documentation

## 2013-05-03 DIAGNOSIS — N1330 Unspecified hydronephrosis: Secondary | ICD-10-CM | POA: Insufficient documentation

## 2013-05-03 DIAGNOSIS — R319 Hematuria, unspecified: Secondary | ICD-10-CM | POA: Insufficient documentation

## 2013-05-03 HISTORY — PX: CYSTOSCOPY/RETROGRADE/URETEROSCOPY: SHX5316

## 2013-05-03 HISTORY — PX: CYSTOSCOPY WITH STENT PLACEMENT: SHX5790

## 2013-05-03 LAB — POCT I-STAT, CHEM 8
BUN: 21 mg/dL (ref 6–23)
Calcium, Ion: 1.1 mmol/L — ABNORMAL LOW (ref 1.12–1.23)
Chloride: 105 mEq/L (ref 96–112)
Creatinine, Ser: 1.2 mg/dL (ref 0.50–1.35)
Glucose, Bld: 86 mg/dL (ref 70–99)
Potassium: 4 mEq/L (ref 3.5–5.1)

## 2013-05-03 SURGERY — CYSTOSCOPY/RETROGRADE/URETEROSCOPY
Anesthesia: General | Site: Ureter | Laterality: Right | Wound class: Clean Contaminated

## 2013-05-03 MED ORDER — FENTANYL CITRATE 0.05 MG/ML IJ SOLN
INTRAMUSCULAR | Status: DC | PRN
  Administered 2013-05-03: 100 ug via INTRAVENOUS

## 2013-05-03 MED ORDER — SULFAMETHOXAZOLE-TMP DS 800-160 MG PO TABS: 1.0000 | ORAL_TABLET | Freq: Every day | ORAL | Status: AC

## 2013-05-03 MED ORDER — ONDANSETRON HCL 4 MG/2ML IJ SOLN
INTRAMUSCULAR | Status: DC | PRN
  Administered 2013-05-03: 4 mg via INTRAVENOUS

## 2013-05-03 MED ORDER — SODIUM CHLORIDE 0.9 % IR SOLN
Status: DC | PRN
  Administered 2013-05-03: 6000 mL

## 2013-05-03 MED ORDER — GENTAMICIN IN SALINE 1.6-0.9 MG/ML-% IV SOLN
80.0000 mg | INTRAVENOUS | Status: DC
  Filled 2013-05-03: qty 50

## 2013-05-03 MED ORDER — TRAMADOL HCL 50 MG PO TABS: 100.0000 mg | ORAL_TABLET | Freq: Four times a day (QID) | ORAL | Status: AC | PRN

## 2013-05-03 MED ORDER — LIDOCAINE HCL (CARDIAC) 20 MG/ML IV SOLN
INTRAVENOUS | Status: DC | PRN
  Administered 2013-05-03: 100 mg via INTRAVENOUS

## 2013-05-03 MED ORDER — DEXAMETHASONE SODIUM PHOSPHATE 4 MG/ML IJ SOLN
INTRAMUSCULAR | Status: DC | PRN
  Administered 2013-05-03: 10 mg via INTRAVENOUS

## 2013-05-03 MED ORDER — SENNA-DOCUSATE SODIUM 8.6-50 MG PO TABS: 1.0000 | ORAL_TABLET | Freq: Two times a day (BID) | ORAL | Status: AC

## 2013-05-03 MED ORDER — MIDAZOLAM HCL 5 MG/5ML IJ SOLN
INTRAMUSCULAR | Status: DC | PRN
  Administered 2013-05-03: 2 mg via INTRAVENOUS

## 2013-05-03 MED ORDER — LACTATED RINGERS IV SOLN
INTRAVENOUS | Status: DC
  Filled 2013-05-03: qty 1000

## 2013-05-03 MED ORDER — FENTANYL CITRATE 0.05 MG/ML IJ SOLN
25.0000 ug | INTRAMUSCULAR | Status: DC | PRN
  Filled 2013-05-03: qty 1

## 2013-05-03 MED ORDER — TRAMADOL HCL 50 MG PO TABS
50.0000 mg | ORAL_TABLET | Freq: Four times a day (QID) | ORAL | Status: DC | PRN
  Administered 2013-05-03: 50 mg via ORAL
  Filled 2013-05-03: qty 1

## 2013-05-03 MED ORDER — IOHEXOL 350 MG/ML SOLN
INTRAVENOUS | Status: DC | PRN
  Administered 2013-05-03: 5 mL

## 2013-05-03 MED ORDER — GENTAMICIN SULFATE 40 MG/ML IJ SOLN
5.0000 mg/kg | INTRAVENOUS | Status: AC
  Administered 2013-05-03: 360 mg via INTRAVENOUS
  Filled 2013-05-03: qty 9

## 2013-05-03 MED ORDER — LACTATED RINGERS IV SOLN
INTRAVENOUS | Status: DC
  Administered 2013-05-03: 11:00:00 via INTRAVENOUS
  Filled 2013-05-03: qty 1000

## 2013-05-03 MED ORDER — KETOROLAC TROMETHAMINE 30 MG/ML IJ SOLN
INTRAMUSCULAR | Status: DC | PRN
  Administered 2013-05-03: 30 mg via INTRAVENOUS

## 2013-05-03 MED ORDER — PROPOFOL 10 MG/ML IV BOLUS
INTRAVENOUS | Status: DC | PRN
  Administered 2013-05-03: 100 mg via INTRAVENOUS
  Administered 2013-05-03: 200 mg via INTRAVENOUS

## 2013-05-03 MED ORDER — KETOROLAC TROMETHAMINE 10 MG PO TABS: 10.0000 mg | ORAL_TABLET | Freq: Four times a day (QID) | ORAL | Status: AC | PRN

## 2013-05-03 SURGICAL SUPPLY — 23 items
BAG URO CATCHER STRL LF (DRAPE) ×3 IMPLANT
BASKET LASER NITINOL 1.9FR (BASKET) ×3 IMPLANT
BASKET ZERO TIP NITINOL 2.4FR (BASKET) IMPLANT
CANISTER SUCT LVC 12 LTR MEDI- (MISCELLANEOUS) ×3 IMPLANT
CATH FOLEY 2WAY  3CC  8FR (CATHETERS)
CATH FOLEY 2WAY 3CC 8FR (CATHETERS) IMPLANT
CATH INTERMIT  6FR 70CM (CATHETERS) IMPLANT
CLOTH BEACON ORANGE TIMEOUT ST (SAFETY) ×3 IMPLANT
DRAPE CAMERA CLOSED 9X96 (DRAPES) ×3 IMPLANT
GLOVE BIO SURGEON STRL SZ 6 (GLOVE) ×3 IMPLANT
GLOVE BIO SURGEON STRL SZ7.5 (GLOVE) ×3 IMPLANT
GLOVE BIOGEL PI IND STRL 6.5 (GLOVE) ×4 IMPLANT
GLOVE BIOGEL PI INDICATOR 6.5 (GLOVE) ×2
GOWN PREVENTION PLUS XLARGE (GOWN DISPOSABLE) ×3 IMPLANT
GOWN STRL NON-REIN LRG LVL3 (GOWN DISPOSABLE) ×3 IMPLANT
GUIDEWIRE ANG ZIPWIRE 038X150 (WIRE) ×3 IMPLANT
GUIDEWIRE STR DUAL SENSOR (WIRE) ×3 IMPLANT
IV NS IRRIG 3000ML ARTHROMATIC (IV SOLUTION) ×6 IMPLANT
PACK CYSTOSCOPY (CUSTOM PROCEDURE TRAY) ×3 IMPLANT
SHEATH ACCESS URETERAL 38CM (SHEATH) ×3 IMPLANT
STENT POLARIS LOOP 6FR X 24 CM (STENTS) ×3 IMPLANT
SYRINGE 10CC LL (SYRINGE) ×3 IMPLANT
TUBE FEEDING 8FR 16IN STR KANG (MISCELLANEOUS) ×3 IMPLANT

## 2013-05-03 NOTE — Anesthesia Postprocedure Evaluation (Signed)
Anesthesia Post Note  Patient: Jack Becker  Procedure(s) Performed: Procedure(s) (LRB): CYSTOSCOPY/RETROGRADE/ DIAGNOSTIC URETEROSCOPY (Right) CYSTOSCOPY WITH STENT PLACEMENT (Right)  Anesthesia type: General  Patient location: PACU  Post pain: Pain level controlled  Post assessment: Post-op Vital signs reviewed  Last Vitals: BP 116/78  Pulse 51  Temp(Src) 36.4 C (Oral)  Resp 16  Ht 5\' 7"  (1.702 m)  Wt 182 lb (82.555 kg)  BMI 28.5 kg/m2  SpO2 99%  Post vital signs: Reviewed  Level of consciousness: sedated  Complications: No apparent anesthesia complications

## 2013-05-03 NOTE — Anesthesia Procedure Notes (Signed)
Procedure Name: LMA Insertion Date/Time: 05/03/2013 12:41 PM Performed by: Maris Berger T Pre-anesthesia Checklist: Patient identified, Emergency Drugs available, Suction available and Patient being monitored Patient Re-evaluated:Patient Re-evaluated prior to inductionOxygen Delivery Method: Circle System Utilized Preoxygenation: Pre-oxygenation with 100% oxygen Intubation Type: IV induction Ventilation: Mask ventilation without difficulty LMA: LMA inserted LMA Size: 5.0 Number of attempts: 1 Airway Equipment and Method: bite block Placement Confirmation: positive ETCO2 Dental Injury: Teeth and Oropharynx as per pre-operative assessment

## 2013-05-03 NOTE — Anesthesia Preprocedure Evaluation (Signed)

## 2013-05-03 NOTE — Brief Op Note (Signed)
05/03/2013  1:22 PM  PATIENT:  Jack Becker  37 y.o. male  PRE-OPERATIVE DIAGNOSIS:  Persistant Right Hydronephrosis after Pyeloplasty  POST-OPERATIVE DIAGNOSIS:  Persistant Right Hydronephrosis after Pyeloplasty  PROCEDURE:  Procedure(s): CYSTOSCOPY/RETROGRADE/ DIAGNOSTIC URETEROSCOPY (Right) CYSTOSCOPY WITH STENT PLACEMENT (Right)  SURGEON:  Surgeon(s) and Role:    * Sebastian Ache, MD - Primary  PHYSICIAN ASSISTANT:   ASSISTANTS: none   ANESTHESIA:   general  EBL:  Total I/O In: 200 [I.V.:200] Out: -   BLOOD ADMINISTERED:none  DRAINS: none   LOCAL MEDICATIONS USED:  NONE  SPECIMEN:  Source of Specimen:  punctate intrarenal stones - discard  DISPOSITION OF SPECIMEN:  discard  COUNTS:  YES  TOURNIQUET:  * No tourniquets in log *  DICTATION: .Other Dictation: Dictation Number [id number]  PLAN OF CARE: Discharge to home after PACU  PATIENT DISPOSITION:  PACU - hemodynamically stable.   Delay start of Pharmacological VTE agent (>24hrs) due to surgical blood loss or risk of bleeding: not applicable

## 2013-05-03 NOTE — Transfer of Care (Signed)
Immediate Anesthesia Transfer of Care Note  Patient: Jack Becker  Procedure(s) Performed: Procedure(s): CYSTOSCOPY/RETROGRADE/ DIAGNOSTIC URETEROSCOPY (Right) CYSTOSCOPY WITH STENT PLACEMENT (Right)  Patient Location: PACU  Anesthesia Type:General  Level of Consciousness: sedated  Airway & Oxygen Therapy: Patient Spontanous Breathing and Patient connected to nasal cannula oxygen  Post-op Assessment: Report given to PACU RN and Post -op Vital signs reviewed and stable  Post vital signs: Reviewed and stable  Complications: No apparent anesthesia complications

## 2013-05-03 NOTE — OR Nursing (Signed)
Patient arrived to Or  With no stent in place on right.

## 2013-05-03 NOTE — H&P (Signed)
Jack Becker is an 37 y.o. male.    Chief Complaint: Pre-Op Rt Diagnostic Ureteroscopy / Possible Balloon Dilation Ureteral Stricture  HPI:   1 - Rt UPJ Obstruction -  s/p Rt robotic dismembered pyeloplasty and subtraction of redundant renal pelvis on 01/06/13. Had lower pole crossing vessel. Stent removed about 4 weeks post-op. Pre-op Renogram confirmed approx 25% Rt / 75% Lt function and very redundant Rt renal pelvis. F/u CT 03/2013 with persistant intrarenal hydro as expected but no contrast traversing ureter definitively.  2 - Hematuria / Flank Pain - Pt with few weeks of on/off gross hematuria and some right flank pain. No fevers. Has been requiring tramadol at times, othertimes completely asymptomatic. Repeat CT 03/2013 as per abvoe.   PMH otherwise unremarkable.  NO CV disease.  Today Jack Becker is seen for repeat endoscopic exam of his proximal rt ureter to verify no recurrent obstruction, and if so, attempt balloon dilation. Most recent UA without infectious parameters.  Past Medical History  Diagnosis Date  . History of diverticulitis of colon   . Right flank pain   . Renal calculi     BILATERAL NON-OBSTRUCTIVE KIDNEY STONES  . Congenital ureteropelvic junction obstruction     RIGHT SIDE  . Hydronephrosis, right     Past Surgical History  Procedure Laterality Date  . Cystoscopy w/ ureteral stent placement Right 12/06/2012    Procedure: CYSTOSCOPY WITH RIGHT RETROGRADE PYELOGRAM AND STENT PLACEMENT;  Surgeon: Anner Crete, MD;  Location: [facility];  Service: Urology;  Laterality: Right;  . Robot assisted pyeloplasty Right 01/06/2013    Procedure: ROBOTIC ASSISTED PYELOPLASTY;  Surgeon: Sebastian Ache, MD;  Location: [facility];  Service: Urology;  Laterality: Right;  . Cystoscopy w/ ureteral stent placement Right 01/06/2013    Procedure: CYSTOSCOPY WITH RETROGRADE PYELOGRAM/URETERAL STENT PLACEMENT;  Surgeon: Sebastian Ache, MD;  Location: [facility];  Service: Urology;   Laterality: Right;  . Cystoscopy w/ ureteral stent removal Right 02/03/2013    Procedure: CYSTOSCOPY WITH RIGHT STENT REMOVAL;  Surgeon: Sebastian Ache, MD;  Location: [facility];  Service: Urology;  Laterality: Right;     History reviewed. No pertinent family history. Social History:  reports that he has never smoked. His smokeless tobacco use includes Snuff. He reports that he does not drink alcohol or use illicit drugs.  Allergies: No Known Allergies  No prescriptions prior to admission    No results found for this or any previous visit (from the past 48 hour(s)). No results found.  Review of Systems  Constitutional: Negative.  Negative for fever and chills.  HENT: Negative.   Eyes: Negative.   Respiratory: Negative.   Cardiovascular: Negative.   Gastrointestinal: Negative.   Genitourinary: Negative.   Musculoskeletal: Negative.   Skin: Negative.   Neurological: Negative.   Endo/Heme/Allergies: Negative.   Psychiatric/Behavioral: Negative.     Height 5\' 7"  (1.702 m), weight 81.647 kg (180 lb). Physical Exam  Constitutional: He is oriented to person, place, and time. He appears well-developed and well-nourished.  HENT:  Head: Normocephalic and atraumatic.  Eyes: EOM are normal. Pupils are equal, round, and reactive to light.  Neck: Normal range of motion. Neck supple.  Cardiovascular: Normal rate and regular rhythm.   Respiratory: Effort normal and breath sounds normal.  GI: Soft.  Prior port sites c/d/i withotu hernias  Genitourinary: Penis normal.  Musculoskeletal: Normal range of motion.  Neurological: He is alert and oriented to person, place, and time.  Skin: Skin is  warm and dry.  Psychiatric: He has a normal mood and affect. His behavior is normal. Judgment and thought content normal.     Assessment/Plan  1 - Rt UPJ Obstruction - Now s/p pyeloplasty  ReExplained that his kidney will never "look normal" by Xray and that success is measured in  terms of stabilty of renal funciton (hopeful no furhter decline) and resolution of bothersome flank pain. As his symptoms have been persistant, and CT could not answer question of UPJ patency, we will proceed today as planned with rt diagnostic ureteroscopy, possible balloon dilation.   We rediscussed ureteroscopy with possible balloon dilationin detail.  We discussed risks including bleeding, infection, damage to kidney / ureter  bladder, rarely loss of kidney. We discussed anesthetic risks and rare but serious surgical complications including DVT, PE, MI, and mortality. The patient voiced understanding and wises to proceed.   2 - Hematuria / Flank Pain - Proceed as per above to r/o recurrent high-grade obstruciton.  Jack Becker 05/03/2013, 7:30 AM

## 2013-05-04 ENCOUNTER — Encounter (HOSPITAL_BASED_OUTPATIENT_CLINIC_OR_DEPARTMENT_OTHER): Payer: Self-pay | Admitting: Urology

## 2013-05-04 NOTE — Op Note (Signed)
Jack Becker, Jack Becker NO.:  [id number]  MEDICAL RECORD NO.:  [id number]  LOCATION:                                 FACILITY:  PHYSICIAN:  Sebastian Ache, MD     DATE OF BIRTH:  1975/11/10  DATE OF PROCEDURE:  05/03/2013 DATE OF DISCHARGE:                              OPERATIVE REPORT   PREOPERATIVE DIAGNOSES:  Residual flank pain, hydronephrosis after pyeloplasty.  POSTOPERATIVE DIAGNOSES:  Residual flank pain, hydronephrosis after pyeloplasty.  PROCEDURE: 1. Cystoscopy with right retrograde pyelogram interpretation. 2. Right diagnostic ureteroscopy. 3. Insertion of right ureteral stent 6 x 24 with tether to the dorsum     of the penis.  FINDINGS: 1. No evidence of high-grade obstruction in the area of the UPJ easily     accommodating 8-French scope plus safety wire, without any     dilation. 2. Massive hydronephrosis consistent with chronic changes without     ureteronephrosis. 3. Tiny volume punctate stones within the right collecting system, the     largest 1/3 mm.  These were basketed and discarded.  ESTIMATED BLOOD LOSS:  Nil.  COMPLICATIONS:  None.  INDICATION:  Jack Becker is a very pleasant 37 year old gentleman with history of right UPJ obstruction.  He initially presented several years ago at referring facility, but did not have pyeloplasty at that time. He presented to our facility with  colicky right flank Pain and evidence of UPJ obstruction.  He underwent initial trial of stenting which seemed to help his symptoms.  He underwent robotic dismembered pyeloplasty several months ago. A crossing vessel was noted.  He had some subtraction of a very large redundant renal pelvis. Following stent removal, the patient had persistent right colicky flank pain, worrisome for possible recurrent obstruction.  Nuclear medicine renogram was obtained which revealed stable function in very capacious kidney. However, definitive blockage could not be  determined due to capacious nature of this organ.  Further management options including observation versus repeat endoscopic examination with possible balloon dilation of stricture if found and he wished to proceed with the latter.  Informed consent was obtained and placed in the medical record.  PROCEDURE IN DETAIL:  The patient being Jack Becker was verified.  Procedure being right diagnostic ureteroscopy was confirmed. Procedure was carried out.  Time-out was performed.  Intravenous antibiotics were administered.  General LMA anesthesia was introduced. The patient placed into a low lithotomy position.  Sterile field was created by prepping and draping the patient's penis, perineum, and proximal thighs using iodine x3.  Next, cystourethroscopy was performed using a 22-French rigid cystoscope with 12-degree offset lens. Inspection of the anterior-posterior urethra was unremarkable. Inspection of the urinary bladder revealed no diverticula, calcifications, papillary lesions.  Ureteral orifice in the normal anatomic location.  There was efflux of urine seen with ureteral jets from bilateral orifices.  The right ureteral orifice was cannulated with a 6-French end-hole catheter and right retrograde pyelogram was obtained.  Right retrograde pyelogram demonstrated a single right ureter with single system, right kidney.  There was massive hydronephrosis without ureteral nephrosis.  The end-hole catheter was repositioned to the area of the UPJ and additional spot images were  taken, which revealed no evidence of filling defect or significant narrowing in this location.  A 0.038 Glidewire was advanced at the level of the upper pole and set aside as a safety wire.  Next, semi-rigid ureteroscopy was performed of the distal two-thirds of the right ureter alongside a separate Sensor working wire with an 8-French feeding tube in the urinary bladder for pressure release.  This revealed no  mucosal abnormalities of the right ureter.  Next, semi-rigid ureteroscope was exchanged for a 38 cm 12 size 14 ureteral access sheath which was placed over the working wire at the level of proximal ureter using fluoroscopic guidance.  Next, flexible ureteroscopy was performed using 8-French digital ureteroscope with the proximal ureter and right kidney and this revealed a widely patent area of ureteropelvic junction and repair.  An easily accommodating 8-French scope alongside the safety wire without any need for dilation whatsoever.  Indeed, the right kidney was very capacious consistent with chronic changes.  There were some very, very small less than 1/3rd mm punctate calcifications within the renal pelvis.  These were grasped within the escape basket and brought out and set aside for discard. Given this very favorable findings suggestive of chronic changes and no evidence of recurrent obstruction, it was felt that no need for balloon dilation was needed.  As such, the sheath was removed under continuous ureteroscopic vision.  No mucosal abnormalities were found.  A new 6 x 24 Polaris-type stent was carefully placed using a cystoscopic and fluoroscopic guidance from proximal and distal deployment were noted. This was tethered to the dorsum of the penis.  Bladder was emptied per in and out catheter and procedure was then terminated.  The patient tolerated the procedure well.  There were no immediate periprocedural complications.  The patient was taken to postanesthesia care unit in stable condition.          ______________________________ Sebastian Ache, MD     TM/MEDQ  D:  05/03/2013  T:  05/04/2013  Job:  [id number]

## 2013-06-20 ENCOUNTER — Encounter (HOSPITAL_BASED_OUTPATIENT_CLINIC_OR_DEPARTMENT_OTHER): Payer: Self-pay | Admitting: Urology

## 2013-12-07 ENCOUNTER — Other Ambulatory Visit (HOSPITAL_COMMUNITY): Payer: Self-pay | Admitting: Urology

## 2013-12-07 DIAGNOSIS — Q6211 Congenital occlusion of ureteropelvic junction: Principal | ICD-10-CM

## 2013-12-07 DIAGNOSIS — Q6239 Other obstructive defects of renal pelvis and ureter: Secondary | ICD-10-CM

## 2013-12-14 ENCOUNTER — Ambulatory Visit (HOSPITAL_COMMUNITY)
Admission: RE | Admit: 2013-12-14 | Discharge: 2013-12-14 | Disposition: A | Discharge status: Home / Self Care | Payer: 59 | Source: Ambulatory Visit | Attending: Urology | Admitting: Urology

## 2013-12-14 DIAGNOSIS — N1330 Unspecified hydronephrosis: Secondary | ICD-10-CM | POA: Insufficient documentation

## 2013-12-14 DIAGNOSIS — Q6239 Other obstructive defects of renal pelvis and ureter: Secondary | ICD-10-CM

## 2013-12-14 DIAGNOSIS — Q6211 Congenital occlusion of ureteropelvic junction: Secondary | ICD-10-CM

## 2013-12-14 DIAGNOSIS — N135 Crossing vessel and stricture of ureter without hydronephrosis: Secondary | ICD-10-CM | POA: Insufficient documentation

## 2013-12-14 MED ORDER — TECHNETIUM TC 99M MERTIATIDE
16.0000 | Freq: Once | INTRAVENOUS | Status: AC | PRN
  Administered 2013-12-14: 16 via INTRAVENOUS

## 2013-12-14 MED ORDER — FUROSEMIDE 10 MG/ML IJ SOLN
40.0000 mg | Freq: Once | INTRAMUSCULAR | Status: AC
  Administered 2013-12-14: 40 mg via INTRAVENOUS
  Filled 2013-12-14: qty 4

## 2015-02-04 IMAGING — CT CT ABD-PELV W/O CM
2 of 4 series · 16 of 46 positions shown, 18 images · non-contrast
Comparison: None.

CLINICAL DATA: Right flank pain.  Prior right ureteral obstruction.
History U P J obstruction.

CT ABDOMEN AND PELVIS WITHOUT CONTRAST
TECHNIQUE: Multidetector CT imaging of the abdomen and pelvis was
performed following the standard protocol without intravenous
contrast.

[Series 2: renal stone < 200 lbs 5.0 b31f · axial · 0.77mm/px · z∈[-440,-6]mm · 13 of 95 slices shown, 15 images]
[im 4/95  soft-tissue]
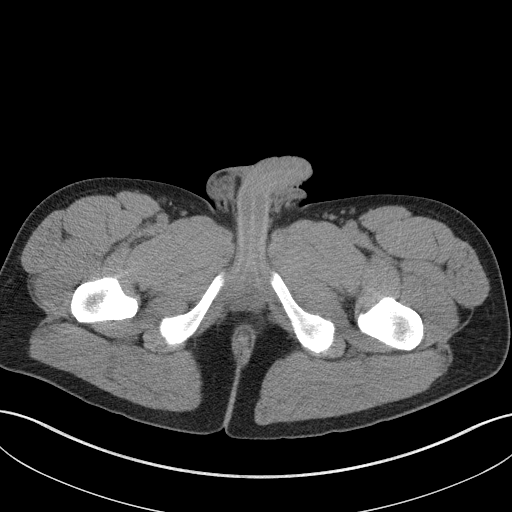
[im 4/95  bone]
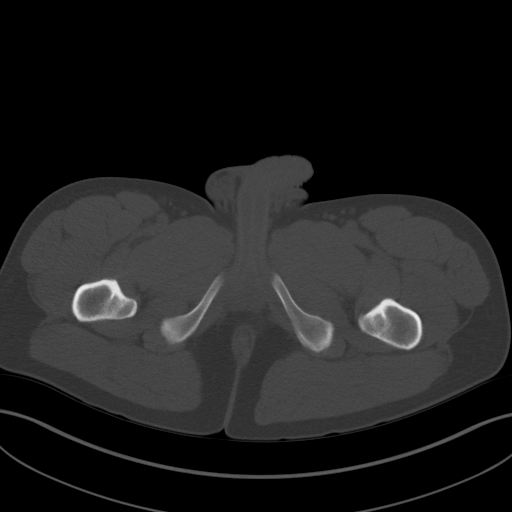
[im 12/95  soft-tissue]
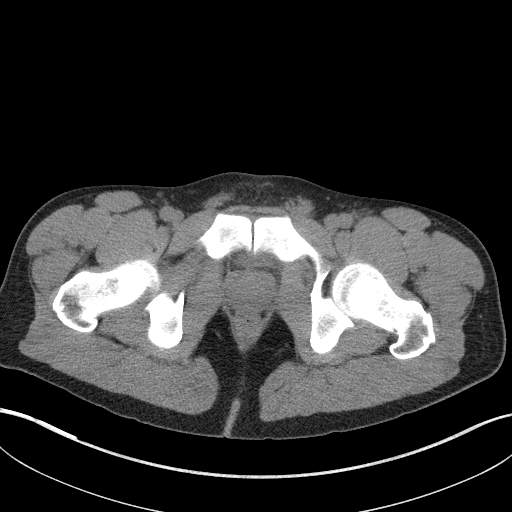
[im 19/95  soft-tissue]
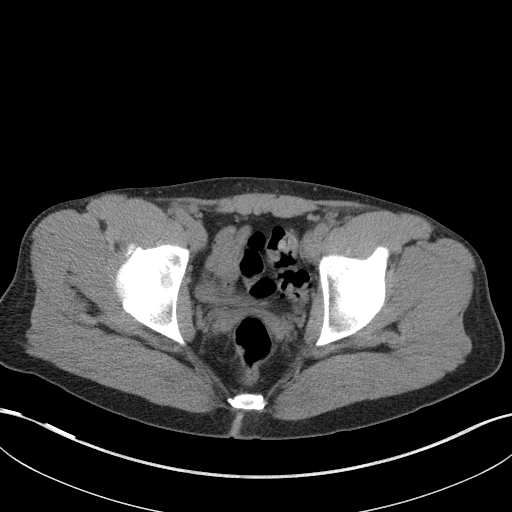
[im 27/95  soft-tissue]
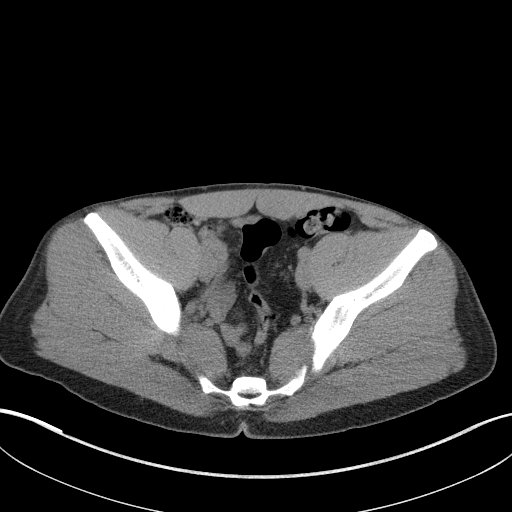
[im 34/95  soft-tissue]
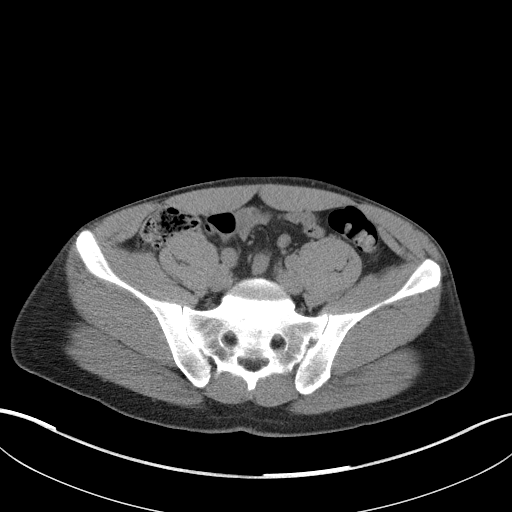
[im 42/95  soft-tissue]
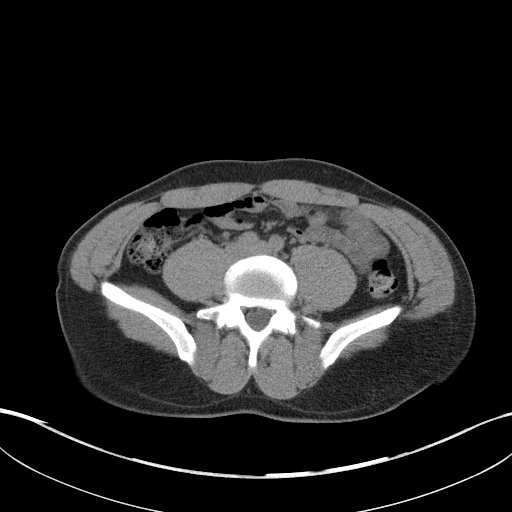
[im 49/95  soft-tissue]
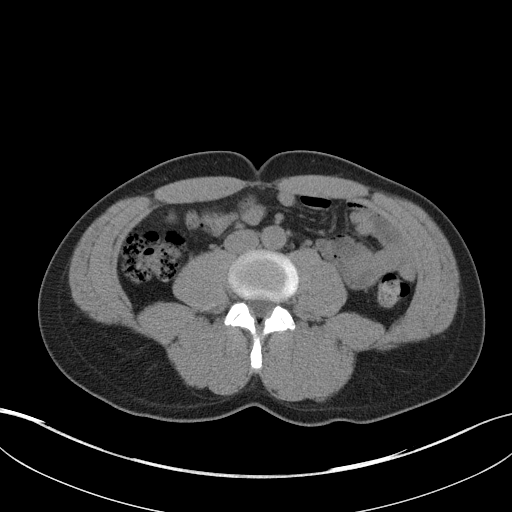
[im 53/95  soft-tissue]
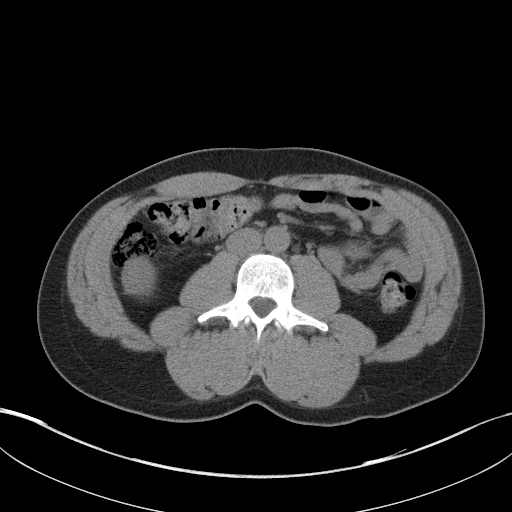
[im 61/95  soft-tissue]
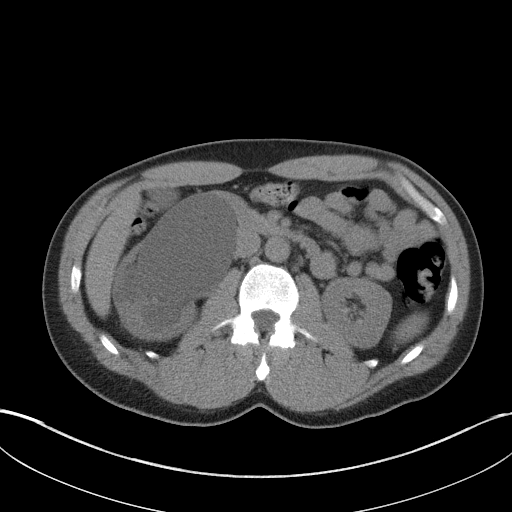
[im 61/95  bone]
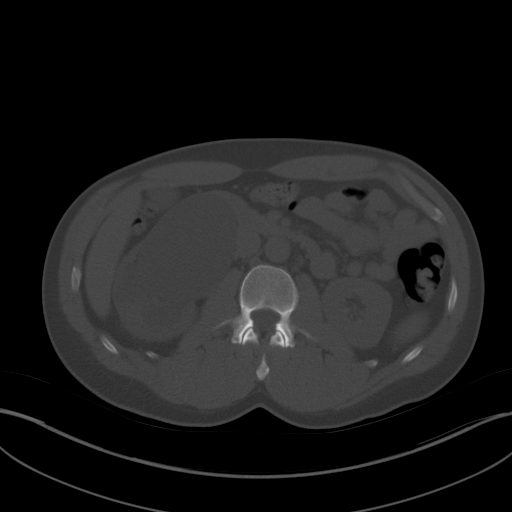
[im 68/95  soft-tissue]
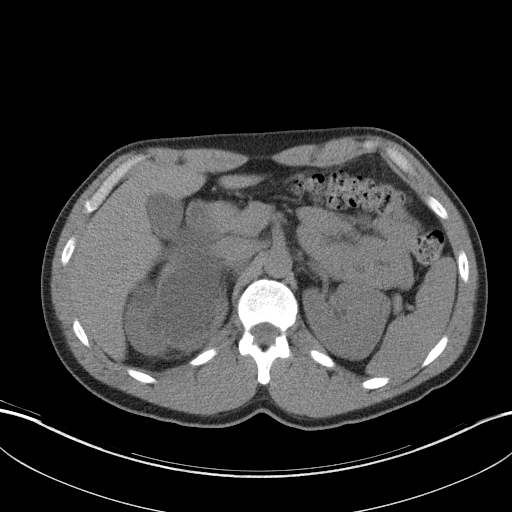
[im 76/95  soft-tissue]
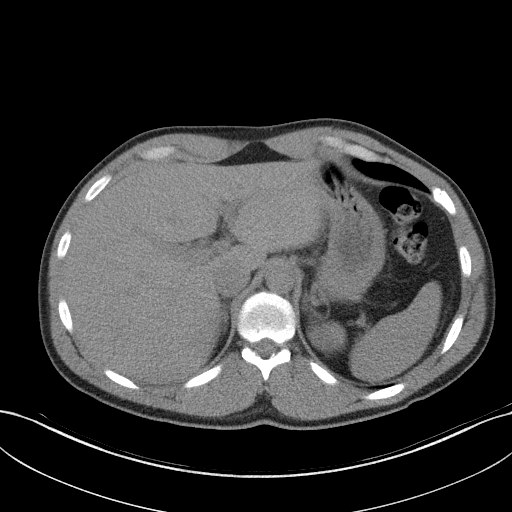
[im 83/95  soft-tissue]
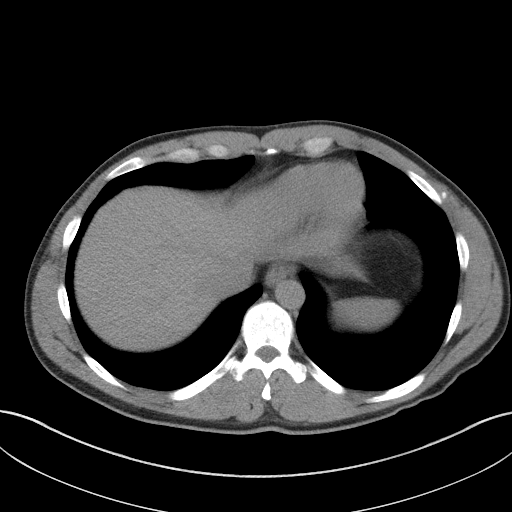
[im 91/95  soft-tissue]
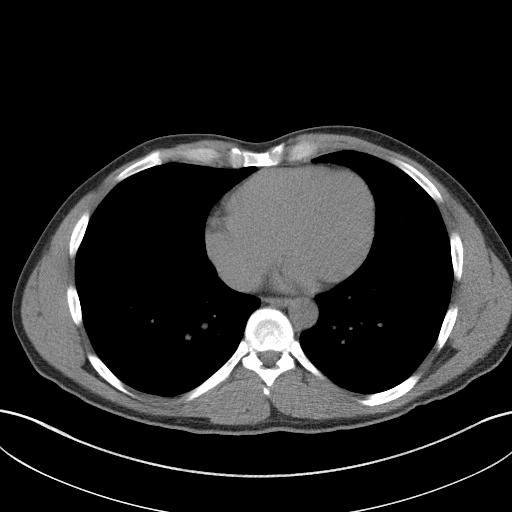

[Series 5: renal stone 3.0 coronal · coronal · 0.91mm/px · 3 of 74 slices shown]
[im 25/74  soft-tissue]
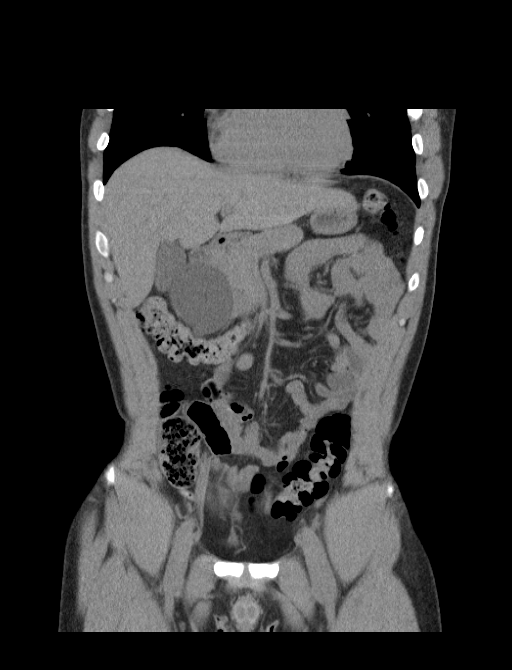
[im 33/74  soft-tissue]
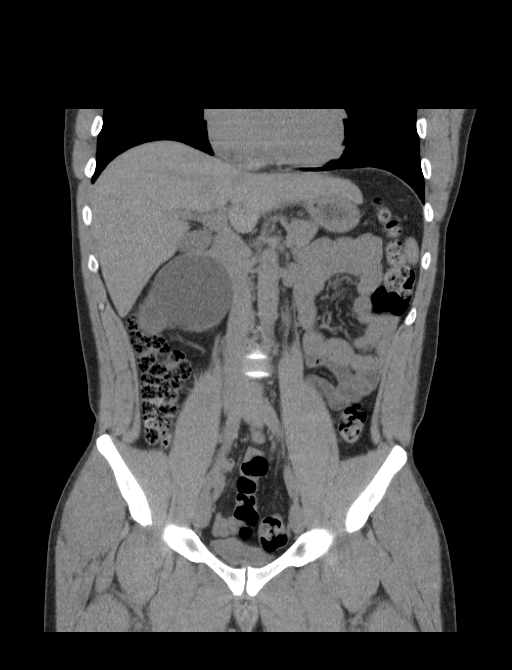
[im 41/74  soft-tissue]
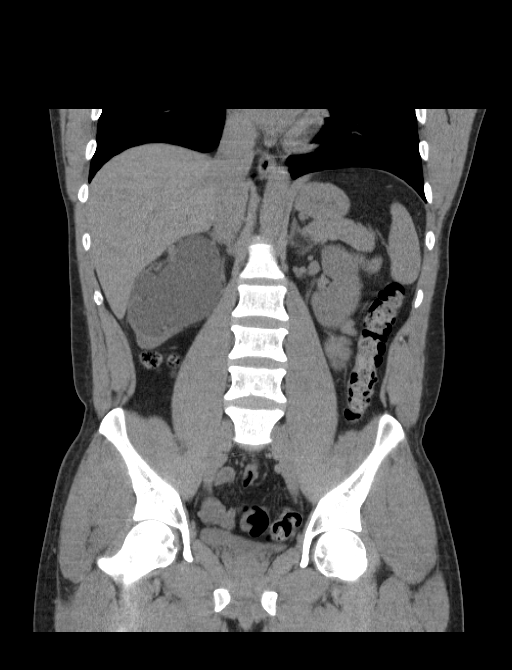

[16 of 46 positions shown; findings below may reference images not displayed]

FINDINGS: Severe right hydronephrosis.  The right ureters
decompressed.  Findings compatible with severe UPJ obstruction,
presumably chronic as the overlying renal parenchyma is then.
Small nonobstructing bilateral renal stones.

Liver, spleen, pancreas, adrenals and gallbladder unremarkable.

Large and small bowel grossly unremarkable.  No free fluid, free
air or adenopathy.  Urinary bladder decompressed.

No acute bony abnormality. Lung bases are clear.  No effusions.
Heart is normal size.  Old partially healed left anterior lower rib
fractures.
IMPRESSION: Severe right hydronephrosis with a chronic UPJ obstruction
appearance.  Overlying renal cortical thinning suggesting chronic
process.

Punctate bilateral nephrolithiasis.

## 2023-04-20 ENCOUNTER — Ambulatory Visit: Payer: BC Managed Care – PPO | Admitting: Sports Medicine

## 2023-04-20 ENCOUNTER — Encounter: Payer: Self-pay | Admitting: Sports Medicine

## 2023-04-20 VITALS — BP 217/134 | HR 69 | Ht 67.0 in | Wt 215.0 lb

## 2023-04-20 DIAGNOSIS — F39 Unspecified mood [affective] disorder: Secondary | ICD-10-CM | POA: Insufficient documentation

## 2023-04-20 DIAGNOSIS — R030 Elevated blood-pressure reading, without diagnosis of hypertension: Secondary | ICD-10-CM | POA: Diagnosis not present

## 2023-04-20 DIAGNOSIS — N529 Male erectile dysfunction, unspecified: Secondary | ICD-10-CM

## 2023-04-20 DIAGNOSIS — Z0000 Encounter for general adult medical examination without abnormal findings: Secondary | ICD-10-CM | POA: Diagnosis not present

## 2023-04-20 DIAGNOSIS — E559 Vitamin D deficiency, unspecified: Secondary | ICD-10-CM

## 2023-04-20 DIAGNOSIS — R1013 Epigastric pain: Secondary | ICD-10-CM

## 2023-04-20 DIAGNOSIS — E538 Deficiency of other specified B group vitamins: Secondary | ICD-10-CM | POA: Diagnosis not present

## 2023-04-20 DIAGNOSIS — E291 Testicular hypofunction: Secondary | ICD-10-CM

## 2023-04-20 MED ORDER — BLOOD PRESSURE KIT: PACK | 0 refills | Status: AC

## 2023-04-20 MED ORDER — PANTOPRAZOLE SODIUM 40 MG PO TBEC
40.0000 mg | DELAYED_RELEASE_TABLET | Freq: Every day | ORAL | 3 refills | Status: AC
Start: 2023-04-20 — End: ?

## 2023-04-20 MED ORDER — BUPROPION HCL ER (XL) 150 MG PO TB24
150.0000 mg | ORAL_TABLET | ORAL | 3 refills | Status: AC
Start: 2023-04-20 — End: ?

## 2023-04-20 NOTE — Assessment & Plan Note (Signed)
Long history of midepigastric pain, reflux symptoms, Zantac ineffective.  No melena, hematochezia, hematemesis. Adding pantoprazole. Return to see me in 6 weeks for this, GI referral if not better.

## 2023-04-20 NOTE — Assessment & Plan Note (Signed)
Blood pressure is significantly elevated, he will go home with a blood pressure cuff and if persistent elevated readings we will consider the addition of amlodipine.

## 2023-04-20 NOTE — Assessment & Plan Note (Signed)
Erectile dysfunction, this is typical for men his age, we will discuss treatment in the future. He does have low sex drive, I think this is a combination of his mood disorder, marijuana use.

## 2023-04-20 NOTE — Assessment & Plan Note (Signed)
Press is having significant depressive symptoms, he has PTSD symptoms, shame, he has difficulty focusing, he does carry diagnoses of PTSD, depression, anxiety, and ADHD from the [employer]. I advised him that we would try to think of this as a mood disorder as an umbrella, and treat the symptoms individually. We will add Wellbutrin 150 as he has had some apathy with SSRIs. Also adding behavioral therapy. Return to see me in 6 weeks, augmentation of Wellbutrin dose if not better.

## 2023-04-20 NOTE — Assessment & Plan Note (Signed)
Annual physical as above. Jack Becker has a lot going on so I will catch him up on screenings at follow-up visits.

## 2023-04-20 NOTE — Progress Notes (Signed)
Subjective:    CC: Annual Physical Exam  HPI:  This patient is here for their annual physical  I reviewed the past medical history, family history, social history, surgical history, and allergies today and no changes were needed.  Please see the problem list section below in epic for further details.  Past Medical History: Past Medical History:  Diagnosis Date   Congenital ureteropelvic junction obstruction    RIGHT SIDE   History of diverticulitis of colon    Hydronephrosis, right    Renal calculi    BILATERAL NON-OBSTRUCTIVE KIDNEY STONES   Right flank pain    Past Surgical History: Past Surgical History:  Procedure Laterality Date   CYSTOSCOPY W/ URETERAL STENT PLACEMENT Right 12/06/2012   Procedure: CYSTOSCOPY WITH RIGHT RETROGRADE PYELOGRAM AND STENT PLACEMENT;  Surgeon: Anner Crete, MD;  Location: [facility];  Service: Urology;  Laterality: Right;   CYSTOSCOPY W/ URETERAL STENT PLACEMENT Right 01/06/2013   Procedure: CYSTOSCOPY WITH RETROGRADE PYELOGRAM/URETERAL STENT PLACEMENT;  Surgeon: Sebastian Ache, MD;  Location: [facility];  Service: Urology;  Laterality: Right;   CYSTOSCOPY W/ URETERAL STENT REMOVAL Right 02/03/2013   Procedure: CYSTOSCOPY WITH RIGHT STENT REMOVAL;  Surgeon: Sebastian Ache, MD;  Location: [facility];  Service: Urology;  Laterality: Right;    CYSTOSCOPY WITH STENT PLACEMENT Right 05/03/2013   Procedure: CYSTOSCOPY WITH STENT PLACEMENT;  Surgeon: Sebastian Ache, MD;  Location: [facility];  Service: Urology;  Laterality: Right;   CYSTOSCOPY/RETROGRADE/URETEROSCOPY Right 05/03/2013   Procedure: CYSTOSCOPY/RETROGRADE/ DIAGNOSTIC URETEROSCOPY;  Surgeon: Sebastian Ache, MD;  Location: [facility];  Service: Urology;  Laterality: Right;   ROBOT ASSISTED PYELOPLASTY Right 01/06/2013   Procedure: ROBOTIC ASSISTED PYELOPLASTY;  Surgeon: Sebastian Ache, MD;  Location: [facility];  Service: Urology;  Laterality:  Right;   Social History: Social History   Socioeconomic History   Marital status: Legally Separated    Spouse name: Not on file   Number of children: Not on file   Years of education: Not on file   Highest education level: Not on file  Occupational History   Not on file  Tobacco Use   Smoking status: Never   Smokeless tobacco: Current    Types: Snuff   Tobacco comments:    15 YR TOBACCO USE  Substance and Sexual Activity   Alcohol use: No   Drug use: No   Sexual activity: Not on file  Other Topics Concern   Not on file  Social History Narrative   Not on file   Social Determinants of Health   Financial Resource Strain: Not on file  Food Insecurity: Not on file  Transportation Needs: Not on file  Physical Activity: Not on file  Stress: Not on file  Social Connections: Not on file   Family History: No family history on file. Allergies: No Known Allergies Medications: See med rec.  Review of Systems: No headache, visual changes, nausea, vomiting, diarrhea, constipation, dizziness, abdominal pain, skin rash, fevers, chills, night sweats, swollen lymph nodes, weight loss, chest pain, body aches, joint swelling, muscle aches, shortness of breath, mood changes, visual or auditory hallucinations.  Objective:    General: Well Developed, well nourished, and in no acute distress.  Neuro: Alert and oriented x3, extra-ocular muscles intact, sensation grossly intact. Cranial nerves II through XII are intact, motor, sensory, and coordinative functions are all intact. HEENT: Normocephalic, atraumatic, pupils equal round reactive to light, neck supple, no masses, no lymphadenopathy, thyroid nonpalpable. Oropharynx, nasopharynx,  external ear canals are unremarkable. Skin: Warm and dry, no rashes noted.  Cardiac: Regular rate and rhythm, no murmurs rubs or gallops.  Respiratory: Clear to auscultation bilaterally. Not using accessory muscles, speaking in full sentences.  Abdominal:  Soft, nontender, nondistended, positive bowel sounds, no masses, no organomegaly.  Musculoskeletal: Shoulder, elbow, wrist, hip, knee, ankle stable, and with full range of motion.  Impression and Recommendations:    The patient was counselled, risk factors were discussed, anticipatory guidance given.  Midepigastric pain Long history of midepigastric pain, reflux symptoms, Zantac ineffective.  No melena, hematochezia, hematemesis. Adding pantoprazole. Return to see me in 6 weeks for this, GI referral if not better.   Annual physical exam Annual physical as above. [name] has a lot going on so I will catch him up on screenings at follow-up visits.  Elevated blood pressure reading Blood pressure is significantly elevated, he will go home with a blood pressure cuff and if persistent elevated readings we will consider the addition of amlodipine.  Erectile dysfunction Erectile dysfunction, this is typical for men his age, we will discuss treatment in the future. He does have low sex drive, I think this is a combination of his mood disorder, marijuana use.  Mood disorder (HCC) [name] is having significant depressive symptoms, he has PTSD symptoms, shame, he has difficulty focusing, he does carry diagnoses of PTSD, depression, anxiety, and ADHD from the [employer]. I advised him that we would try to think of this as a mood disorder as an umbrella, and treat the symptoms individually. We will add Wellbutrin 150 as he has had some apathy with SSRIs. Also adding behavioral therapy. Return to see me in 6 weeks, augmentation of Wellbutrin dose if not better.   ____________________________________________ Jack Becker. [name], M.D., ABFM., CAQSM., AME. Primary Care and Sports Medicine [facility]  Adjunct Professor of Family Medicine  [employer]  Restaurant manager, fast food

## 2023-04-23 LAB — TESTOSTERONE, FREE, TOTAL, SHBG
Sex Hormone Binding: 51.5 nmol/L (ref 16.5–55.9)
Testosterone, Free: 7.9 pg/mL (ref 6.8–21.5)
Testosterone: 457 ng/dL (ref 264–916)

## 2023-04-23 LAB — LIPID PANEL
Chol/HDL Ratio: 7.1 ratio — ABNORMAL HIGH (ref 0.0–5.0)
Cholesterol, Total: 247 mg/dL — ABNORMAL HIGH (ref 100–199)
HDL: 35 mg/dL — ABNORMAL LOW (ref >39)
LDL Chol Calc (NIH): 178 mg/dL — ABNORMAL HIGH (ref 0–99)
Triglycerides: 182 mg/dL — ABNORMAL HIGH (ref 0–149)
VLDL Cholesterol Cal: 34 mg/dL (ref 5–40)

## 2023-04-23 LAB — HEMOGLOBIN A1C
Est. average glucose Bld gHb Est-mCnc: 105 mg/dL
Hgb A1c MFr Bld: 5.3 % (ref 4.8–5.6)

## 2023-04-23 LAB — COMPREHENSIVE METABOLIC PANEL
ALT: 40 IU/L (ref 0–44)
AST: 19 IU/L (ref 0–40)
Albumin: 4.6 g/dL (ref 4.1–5.1)
Alkaline Phosphatase: 86 IU/L (ref 44–121)
BUN/Creatinine Ratio: 19 (ref 9–20)
BUN: 21 mg/dL (ref 6–24)
Bilirubin Total: 0.3 mg/dL (ref 0.0–1.2)
CO2: 24 mmol/L (ref 20–29)
Calcium: 9.5 mg/dL (ref 8.7–10.2)
Chloride: 104 mmol/L (ref 96–106)
Creatinine, Ser: 1.08 mg/dL (ref 0.76–1.27)
Globulin, Total: 2.3 g/dL (ref 1.5–4.5)
Glucose: 89 mg/dL (ref 70–99)
Potassium: 4.3 mmol/L (ref 3.5–5.2)
Sodium: 142 mmol/L (ref 134–144)
Total Protein: 6.9 g/dL (ref 6.0–8.5)
eGFR: 85 mL/min/{1.73_m2} (ref >59)

## 2023-04-23 LAB — CBC
Hematocrit: 54.3 % — ABNORMAL HIGH (ref 37.5–51.0)
Hemoglobin: 17 g/dL (ref 13.0–17.7)
MCH: 27.5 pg (ref 26.6–33.0)
MCHC: 31.3 g/dL — ABNORMAL LOW (ref 31.5–35.7)
MCV: 88 fL (ref 79–97)
Platelets: 243 10*3/uL (ref 150–450)
RBC: 6.18 x10E6/uL — ABNORMAL HIGH (ref 4.14–5.80)
RDW: 13.9 % (ref 11.6–15.4)
WBC: 5.9 10*3/uL (ref 3.4–10.8)

## 2023-04-23 LAB — VITAMIN D 25 HYDROXY (VIT D DEFICIENCY, FRACTURES): Vit D, 25-Hydroxy: 33.3 ng/mL (ref 30.0–100.0)

## 2023-04-23 LAB — VITAMIN B12: Vitamin B-12: 1109 pg/mL (ref 232–1245)

## 2023-04-23 LAB — TSH: TSH: 2.39 u[IU]/mL (ref 0.450–4.500)

## 2023-05-03 ENCOUNTER — Other Ambulatory Visit: Payer: Self-pay | Admitting: Sports Medicine

## 2023-05-03 DIAGNOSIS — F39 Unspecified mood [affective] disorder: Secondary | ICD-10-CM

## 2023-05-04 ENCOUNTER — Ambulatory Visit: Payer: BC Managed Care – PPO | Admitting: Behavioral Health

## 2023-05-06 DIAGNOSIS — Q6239 Other obstructive defects of renal pelvis and ureter: Secondary | ICD-10-CM | POA: Diagnosis not present

## 2023-05-06 DIAGNOSIS — I10 Essential (primary) hypertension: Secondary | ICD-10-CM | POA: Diagnosis not present

## 2023-05-06 DIAGNOSIS — F4310 Post-traumatic stress disorder, unspecified: Secondary | ICD-10-CM | POA: Diagnosis not present

## 2023-05-06 DIAGNOSIS — F331 Major depressive disorder, recurrent, moderate: Secondary | ICD-10-CM | POA: Diagnosis not present

## 2023-05-06 DIAGNOSIS — E782 Mixed hyperlipidemia: Secondary | ICD-10-CM | POA: Diagnosis not present

## 2023-05-06 DIAGNOSIS — F988 Other specified behavioral and emotional disorders with onset usually occurring in childhood and adolescence: Secondary | ICD-10-CM | POA: Diagnosis not present

## 2023-06-01 ENCOUNTER — Ambulatory Visit: Payer: BC Managed Care – PPO | Admitting: Sports Medicine

## 2024-04-11 ENCOUNTER — Encounter: Payer: Self-pay | Admitting: Sports Medicine
